# Patient Record
Sex: Male | Born: 2000 | Race: White | Hispanic: No | Marital: Single | Smoking: Never smoker
Health system: Southern US, Community
[De-identification: ages and names within clinical notes are randomized; demographics above are authoritative.]

## PROBLEM LIST (undated history)

## (undated) DIAGNOSIS — E669 Obesity, unspecified: Secondary | ICD-10-CM

## (undated) DIAGNOSIS — F909 Attention-deficit hyperactivity disorder, unspecified type: Secondary | ICD-10-CM

## (undated) DIAGNOSIS — F419 Anxiety disorder, unspecified: Secondary | ICD-10-CM

## (undated) DIAGNOSIS — S83249A Other tear of medial meniscus, current injury, unspecified knee, initial encounter: Secondary | ICD-10-CM

## (undated) DIAGNOSIS — E66811 Obesity, class 1: Secondary | ICD-10-CM

## (undated) HISTORY — DX: Attention-deficit hyperactivity disorder, unspecified type: F90.9

## (undated) HISTORY — PX: KNEE ARTHROSCOPY: SHX127

## (undated) HISTORY — DX: Obesity, class 1: E66.811

## (undated) HISTORY — DX: Anxiety disorder, unspecified: F41.9

## (undated) HISTORY — DX: Obesity, unspecified: E66.9

---
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
## 2018-01-22 ENCOUNTER — Encounter: Payer: Self-pay | Admitting: Pediatrics

## 2018-01-22 ENCOUNTER — Institutional Professional Consult (permissible substitution): Payer: 59 | Admitting: Pediatrics

## 2018-01-22 ENCOUNTER — Ambulatory Visit (INDEPENDENT_AMBULATORY_CARE_PROVIDER_SITE_OTHER): Payer: 59 | Admitting: Pediatrics

## 2018-01-22 VITALS — BP 128/69 | HR 53 | Ht 71.0 in | Wt 230.0 lb

## 2018-01-22 DIAGNOSIS — F900 Attention-deficit hyperactivity disorder, predominantly inattentive type: Secondary | ICD-10-CM

## 2018-01-22 DIAGNOSIS — Z79899 Other long term (current) drug therapy: Secondary | ICD-10-CM

## 2018-01-22 DIAGNOSIS — Z719 Counseling, unspecified: Secondary | ICD-10-CM

## 2018-01-22 DIAGNOSIS — R278 Other lack of coordination: Secondary | ICD-10-CM | POA: Diagnosis not present

## 2018-01-22 DIAGNOSIS — Z7189 Other specified counseling: Secondary | ICD-10-CM

## 2018-01-22 DIAGNOSIS — F411 Generalized anxiety disorder: Secondary | ICD-10-CM

## 2018-01-22 MED ORDER — AMPHETAMINE SULFATE 10 MG PO TABS: 20.0000 mg | ORAL_TABLET | Freq: Two times a day (BID) | ORAL | 0 refills | Status: DC

## 2018-01-22 MED ORDER — SERTRALINE HCL 100 MG PO TABS: 200.0000 mg | ORAL_TABLET | Freq: Every day | ORAL | 0 refills | Status: DC

## 2018-01-22 MED ORDER — GUANFACINE HCL ER 3 MG PO TB24: 1.0000 | ORAL_TABLET | Freq: Every evening | ORAL | 0 refills | Status: DC

## 2018-01-22 NOTE — Patient Instructions (Addendum)
DISCUSSION: Patient and family counseled regarding the following coordination of care items:  Continue medication as directed Evekeo 10 mg BID Zoloft 200 mg daily Intuniv 3 mg daily  90 day supply of above submitted. Father aware of possible price change to generic evekeo for 2020.  Will discuss if price is unaffordable. RX for above e-scribed and sent to pharmacy on record  [id number] [place] [place] - [address] [place] [address] Phone: [phone] Fax: [phone]   Counseled medication administration, effects, and possible side effects.  ADHD medications discussed to include different medications and pharmacologic properties of each. Recommendation for specific medication to include dose, administration, expected effects, possible side effects and the risk to benefit ratio of medication management.  Advised importance of:  Good sleep hygiene (8- 10 hours per night)  Limited screen time (none on school nights, no more than 2 hours on weekends)  Regular exercise(outside and active play)  Healthy eating (drink water, no sodas/sweet tea, limit portions and no seconds).  Counseling at this visit included the review of old records and/or current chart with the patient and family.   Counseling included the following discussion points presented at every visit to improve understanding and treatment compliance.  Recent health history and today's examination Growth and development with anticipatory guidance provided regarding brain growth, executive function maturation and pubertal development School progress and continued advocay for appropriate accommodations to include maintain Structure, routine, organization, reward, motivation and consequences.  Additionally the patient was counseled to take medication while driving.

## 2018-01-22 NOTE — Progress Notes (Signed)
Patient ID: Andrew Christian Kadow, male   DOB: [birth year], 17 y.o.   MRN: [id number]  Medication Check  Patient ID: Andrew Christian Sferrazza  DOB: [birth year]  MRN: [id number]  DATE:01/22/18 Rafael BihariKearns, Stephen C, MD  Accompanied by: Father Patient Lives with: mother, father, sister age 19, 3416 and brother age 19   Dorm living, one roommate, two beds bathroom down hallway Good dorm not too loud  HISTORY/CURRENT STATUS: Chief Complaint - Polite and cooperative and present for medical follow up for medication management of ADHD, dysgraphia and anxiety. Last follow up 08/03/2017 and currently prescribed Evekeo 10 mg one every morning and 1/2 to one in the PM if needed. Zoloft 200 mg in the Am and Intuniv 3 mg in the PM. Patient reports daily compliance and medication working well for first year of college.  EDUCATION: School: [employer] Year/Grade: [employer] Playing football, will have spring work outs three days per week, but with more free time Might think about clubs, was busy first Engineer, sitesemester  Micro, calc, Audiological scientistaccounting, earth sci, writing class Thinking of major in finance may do MS with supply chain managment A/B, one C in Calculus DSO accommodations - didn't use this semester.  No car at school this year.  MEDICAL HISTORY: Appetite: WNL   Sleep: Bedtime: up late at times at school, in bed by 2400, occasional later Awakens: 0900 Concerns: Initiation/Maintenance/Other: Asleep easily, sleeps through the night, feels well-rested.  No Sleep concerns.  Individual Medical History/ Review of Systems: Changes? :No  Family Medical/ Social History: Changes? No  Current Medications:  Evekeo 10 mg one in the am and 1/2 to one in the PM Zoloft 200 mg every morning Intuniv 3 mg in the afternoon Medication Side Effects: None  MENTAL HEALTH: Mental Health Issues:  Denies sadness, loneliness or depression. No self harm or thoughts of self harm or injury. Denies fears, worries and anxieties. Has good  peer relations and is not a bully nor is victimized.  Review of Systems  Musculoskeletal: Negative.   Neurological: Negative for seizures and headaches.  Psychiatric/Behavioral: Positive for decreased concentration and sleep disturbance. Negative for behavioral problems. The patient is nervous/anxious. The patient is not hyperactive.   All other systems reviewed and are negative.   PHYSICAL EXAM; Vitals:   01/22/18 0828  BP: 128/69  Pulse: (!) 53  Weight: 230 lb (104.3 kg)  Height: 5\' 11"  (1.803 m)   Body mass index is 32.08 kg/m.  General Physical Exam: Unchanged from previous exam, date:08/03/2017   Testing/Developmental Screens: CGI/ASRS = 18/11 Reviewed with patient and Father     DIAGNOSES:    ICD-10-CM   1. Attention deficit hyperactivity disorder (ADHD), predominantly inattentive type F90.0   2. Dysgraphia R27.8   3. GAD (generalized anxiety disorder) F41.1   4. Medication management Z79.899   5. Patient counseled Z71.9   6. Parenting dynamics counseling Z71.89   7. Counseling and coordination of care Z71.89     RECOMMENDATIONS:  Patient Instructions  DISCUSSION: Patient and family counseled regarding the following coordination of care items:  Continue medication as directed Evekeo 10 mg BID Zoloft 200 mg daily Intuniv 3 mg daily  90 day supply of above submitted. Father aware of possible price change to generic evekeo for 2020.  Will discuss if price is unaffordable. RX for above e-scribed and sent to pharmacy on record  [id number] [place], [place] - [address] [place] [address]  Phone: [phone] Fax: [phone]   Counseled medication administration, effects, and possible side effects.  ADHD medications discussed to include different medications and pharmacologic properties of each. Recommendation for specific medication to include dose, administration, expected effects,  possible side effects and the risk to benefit ratio of medication management.  Advised importance of:  Good sleep hygiene (8- 10 hours per night)  Limited screen time (none on school nights, no more than 2 hours on weekends)  Regular exercise(outside and active play)  Healthy eating (drink water, no sodas/sweet tea, limit portions and no seconds).  Counseling at this visit included the review of old records and/or current chart with the patient and family.   Counseling included the following discussion points presented at every visit to improve understanding and treatment compliance.  Recent health history and today's examination Growth and development with anticipatory guidance provided regarding brain growth, executive function maturation and pubertal development School progress and continued advocay for appropriate accommodations to include maintain Structure, routine, organization, reward, motivation and consequences.  Additionally the patient was counseled to take medication while driving.      Father verbalized understanding of all topics discussed.  NEXT APPOINTMENT:  Return in about 3 months (around 04/23/2018) for Medical Follow up.  Medical Decision-making: More than 50% of the appointment was spent counseling and discussing diagnosis and management of symptoms with the patient and family.  Counseling Time: 25 minutes Total Contact Time: 30 minutes

## 2018-04-13 ENCOUNTER — Other Ambulatory Visit: Payer: Self-pay | Admitting: Pediatrics

## 2018-04-13 NOTE — Telephone Encounter (Signed)
Last visit 01/22/2018 next visit 07/20/2018

## 2018-04-13 NOTE — Telephone Encounter (Signed)
RX for above e-scribed and sent to pharmacy on record  [id number] [place], [place] - [address] [place] [address] Phone: [phone] Fax: [phone]   

## 2018-05-07 ENCOUNTER — Other Ambulatory Visit: Payer: Self-pay

## 2018-05-07 MED ORDER — AMPHETAMINE SULFATE 10 MG PO TABS: 20.0000 mg | ORAL_TABLET | Freq: Two times a day (BID) | ORAL | 0 refills | Status: DC

## 2018-05-07 MED ORDER — GUANFACINE HCL ER 3 MG PO TB24: 1.0000 | ORAL_TABLET | Freq: Every evening | ORAL | 0 refills | Status: DC

## 2018-05-07 NOTE — Telephone Encounter (Signed)
RX for above e-scribed and sent to pharmacy on record  [id number] [place], [place] - [address] [place] [address] Phone: [phone] Fax: [phone]   

## 2018-05-07 NOTE — Telephone Encounter (Signed)
Mom called in refill for Evekeo and Guanfacine. Last visit 01/22/2018 next visit 07/20/2018. [facility] [place] [place]

## 2018-07-20 ENCOUNTER — Encounter: Payer: Self-pay | Admitting: Pediatrics

## 2018-07-20 ENCOUNTER — Telehealth: Payer: Self-pay

## 2018-07-20 ENCOUNTER — Ambulatory Visit (INDEPENDENT_AMBULATORY_CARE_PROVIDER_SITE_OTHER): Payer: 59 | Admitting: Pediatrics

## 2018-07-20 DIAGNOSIS — Z7189 Other specified counseling: Secondary | ICD-10-CM

## 2018-07-20 DIAGNOSIS — R278 Other lack of coordination: Secondary | ICD-10-CM

## 2018-07-20 DIAGNOSIS — F900 Attention-deficit hyperactivity disorder, predominantly inattentive type: Secondary | ICD-10-CM | POA: Diagnosis not present

## 2018-07-20 DIAGNOSIS — F411 Generalized anxiety disorder: Secondary | ICD-10-CM | POA: Diagnosis not present

## 2018-07-20 DIAGNOSIS — Z79899 Other long term (current) drug therapy: Secondary | ICD-10-CM

## 2018-07-20 DIAGNOSIS — Z719 Counseling, unspecified: Secondary | ICD-10-CM

## 2018-07-20 MED ORDER — SERTRALINE HCL 100 MG PO TABS: 150.0000 mg | ORAL_TABLET | Freq: Every morning | ORAL | 2 refills | Status: DC

## 2018-07-20 MED ORDER — GUANFACINE HCL ER 3 MG PO TB24: 1.0000 | ORAL_TABLET | Freq: Every morning | ORAL | 0 refills | Status: DC

## 2018-07-20 MED ORDER — AMPHETAMINE SULFATE 10 MG PO TABS: 20.0000 mg | ORAL_TABLET | Freq: Two times a day (BID) | ORAL | 0 refills | Status: DC

## 2018-07-20 NOTE — Progress Notes (Signed)
[facility]. [address] [place] [address] Dept: [phone] Dept Fax: [phone]  Medication Check by FaceTime due to COVID-19  Patient ID:  Andrew Christian  male DOB: [birth year]   18 y.o.   MRN: [id number]   DATE:07/20/18  PCP: Hezzie Bump, MD  Interviewed: [name]  Location: His vehicle, not driving Provider location: [facility] office  Virtual Visit via Video Note Connected with Andrew Christian on 07/20/18 at  8:30 AM EDT by video enabled telemedicine application and verified that I am speaking with the correct person using two identifiers.    I discussed the limitations, risks, security and privacy concerns of performing an evaluation and management service by telephone and the availability of in person appointments. I also discussed with the patient that there may be a patient responsible charge related to this service. The patient expressed understanding and agreed to proceed.  HISTORY OF PRESENT ILLNESS/CURRENT STATUS: Andrew Christian is being followed for medication management for ADHD, dysgraphia and Anxiety with OCD featrues.   Last visit on 01/23/2019  Cardin currently prescribed Zoloft 200 mg and Evekeo 20 mg twice daily Intuniv 3 mg daily Daytime sleepiness past few months. Eating well (eating breakfast, lunch and dinner).   Sleeping: bedtime 2400-0100 pm and wakes at varied - will be up by 0730 now for work Does not feel tired, sometimes TV and phones but not right up to going to bed.  Feels tired around 2100, but then it goes away. sleeping through the night.  counseled regards sleep hygiene and new work schedule.  Set bedtime by 2200 and up no later than 0800.  Will trial lower dose of zoloft, since patient does not report any anxiety issues presently.  New Job: [employer] orientation - First day on 07/21/2018 May do [place] as family trip later this  summer.  EDUCATION: School: [employer] Year/Grade: Tanis Burnley is currently out of school for social distancing due to COVID-19. Home for spring break then did not go back Finished all classes on line - got all As had to get used to at first but did well Accounting, macro eco, stats, spanish and writing. Will return to campus normal time early for football, classes until Thanksgiving and then finish on-line through winter break  Activities/ Exercise: daily has been working out five days per week for football, will do running all for football conditioning.    Screen time: (phone, tablet, TV, computer): not excessive  MEDICAL HISTORY: Individual Medical History/ Review of Systems: Changes? :No  Family Medical/ Social History: Changes? No   Patient Lives with: mother and father  Sister 51, 43 and brother 15 Mother always works from home Father is now working at home too  Current Medications:  zoloft 200 mg daily evekeo 20 mg BID Intuniv 3mg  daily  Medication Side Effects: Fatigue  MENTAL HEALTH: Mental Health Issues:    Denies sadness, loneliness or depression. No self harm or thoughts of self harm or injury. Denies fears, worries and anxieties. Has good peer relations and is not a bully nor is victimized.  DIAGNOSES:    ICD-10-CM   1. Attention deficit hyperactivity disorder (ADHD), predominantly inattentive type  F90.0   2. Dysgraphia  R27.8   3. GAD (generalized anxiety disorder)  F41.1   4. Medication management  Z79.899   5. Patient counseled  Z71.9   6. Counseling and coordination of care  Z71.89  RECOMMENDATIONS:  Patient Instructions  DISCUSSION: Counseled regarding the following coordination of care items:  Continue medication as directed Decrease Zoloft to 150 mg daily Continue Evekeo 20 mg twice daily Intuniv 3 mg daily - morning dose  RX for above e-scribed and sent to pharmacy on record  [id number] - [place], [place] -  [address] [place] [address] Phone: [phone] Fax: [phone]  Counseled medication administration, effects, and possible side effects.  ADHD medications discussed to include different medications and pharmacologic properties of each. Recommendation for specific medication to include dose, administration, expected effects, possible side effects and the risk to benefit ratio of medication management.  Advised importance of:  Good sleep hygiene (8- 10 hours per night) Set routine, in bed by 2200 and up by 0830  Limited screen time (none on school nights, no more than 2 hours on weekends)  Regular exercise(outside and active play) Make sure to do aerobic exercise (cross train) with weight training for football  Healthy eating (drink water, no sodas/sweet tea)     Discussed continued need for routine, structure, motivation, reward and positive reinforcement  Encouraged recommended limitations on TV, tablets, phones, video games and computers for non-educational activities.  Encouraged physical activity and outdoor play, maintaining social distancing.  Discussed how to talk to anxious children about coronavirus.   Referred to ADDitudemag.com for resources about engaging children who are at home in home and online study.    NEXT APPOINTMENT:  Return in about 3 months (around 10/20/2018) for Medication Check. Please call the office for a sooner appointment if problems arise.  Medical Decision-making: More than 50% of the appointment was spent counseling and discussing diagnosis and management of symptoms with the patient.  I discussed the assessment and treatment plan with the patient. The patient was provided an opportunity to ask questions and all were answered. The patient agreed with the plan and demonstrated an understanding of the instructions.   The patient was advised to call back or seek an in-person  evaluation if the symptoms worsen or if the condition fails to improve as anticipated.  I provided 25 minutes of non-face-to-face time during this encounter.   Completed record review for 0 minutes prior to the virtual video visit.   Leticia PennaBobi A Ronen Bromwell, NP  Counseling Time: 25 minutes   Total Contact Time: 25 minutes

## 2018-07-20 NOTE — Telephone Encounter (Signed)
Pharm faxed in Prior Auth for Evekeo. Last visit 07/20/2018. Submitting Prior Auth to [facility]

## 2018-07-20 NOTE — Patient Instructions (Signed)
DISCUSSION: Counseled regarding the following coordination of care items:  Continue medication as directed Decrease Zoloft to 150 mg daily Continue Evekeo 20 mg twice daily Intuniv 3 mg daily - morning dose  RX for above e-scribed and sent to pharmacy on record  [id number] - [place], [place] - [address] [place] [address] Phone: [phone] Fax: [phone]  Counseled medication administration, effects, and possible side effects.  ADHD medications discussed to include different medications and pharmacologic properties of each. Recommendation for specific medication to include dose, administration, expected effects, possible side effects and the risk to benefit ratio of medication management.  Advised importance of:  Good sleep hygiene (8- 10 hours per night) Set routine, in bed by 2200 and up by 0830  Limited screen time (none on school nights, no more than 2 hours on weekends)  Regular exercise(outside and active play) Make sure to do aerobic exercise (cross train) with weight training for football  Healthy eating (drink water, no sodas/sweet tea)

## 2018-07-20 NOTE — Telephone Encounter (Signed)
Outcome Additional Information Required This medication or product is on your plan's list of covered drugs. Prior authorization is not required at this time. If your pharmacy has questions regarding the processing of your prescription, please have them call the [facility] pharmacy help desk at [phone]. **Please note: Formulary lowering, tiering exception, cost reduction and/or pre-benefit determination review (including prospective Medicare hospice reviews) requests cannot be requested using this method of submission. Please contact us at [phone] instead. 

## 2018-08-13 ENCOUNTER — Ambulatory Visit (INDEPENDENT_AMBULATORY_CARE_PROVIDER_SITE_OTHER): Payer: 59 | Admitting: Family Medicine

## 2018-08-13 ENCOUNTER — Encounter: Payer: Self-pay | Admitting: Family Medicine

## 2018-08-13 ENCOUNTER — Other Ambulatory Visit: Payer: Self-pay

## 2018-08-13 VITALS — BP 100/61 | HR 54 | Temp 98.0°F | Resp 16 | Ht 71.0 in | Wt 236.6 lb

## 2018-08-13 DIAGNOSIS — G4719 Other hypersomnia: Secondary | ICD-10-CM

## 2018-08-13 DIAGNOSIS — R5383 Other fatigue: Secondary | ICD-10-CM

## 2018-08-13 DIAGNOSIS — R400 Somnolence: Secondary | ICD-10-CM

## 2018-08-13 LAB — CBC WITH DIFFERENTIAL/PLATELET
Basophils Absolute: 0 10*3/uL (ref 0.0–0.1)
Basophils Relative: 0.7 % (ref 0.0–3.0)
Eosinophils Absolute: 0.2 10*3/uL (ref 0.0–0.7)
Eosinophils Relative: 4 % (ref 0.0–5.0)
HCT: 46.1 % (ref 36.0–49.0)
Hemoglobin: 15.8 g/dL (ref 12.0–16.0)
Lymphocytes Relative: 40 % (ref 24.0–48.0)
Lymphs Abs: 2 10*3/uL (ref 0.7–4.0)
MCHC: 34.1 g/dL (ref 31.0–37.0)
MCV: 87.7 fl (ref 78.0–98.0)
Monocytes Absolute: 0.5 10*3/uL (ref 0.1–1.0)
Monocytes Relative: 11.2 % (ref 3.0–12.0)
Neutro Abs: 2.2 10*3/uL (ref 1.4–7.7)
Neutrophils Relative %: 44.1 % (ref 43.0–71.0)
Platelets: 262 10*3/uL (ref 150.0–575.0)
RBC: 5.26 Mil/uL (ref 3.80–5.70)
RDW: 13.2 % (ref 11.4–15.5)
WBC: 4.9 10*3/uL (ref 4.5–13.5)

## 2018-08-13 LAB — COMPREHENSIVE METABOLIC PANEL
ALT: 16 U/L (ref 0–53)
AST: 25 U/L (ref 0–37)
Albumin: 4.5 g/dL (ref 3.5–5.2)
Alkaline Phosphatase: 66 U/L (ref 52–171)
BUN: 20 mg/dL (ref 6–23)
CO2: 30 mEq/L (ref 19–32)
Calcium: 9.9 mg/dL (ref 8.4–10.5)
Chloride: 102 mEq/L (ref 96–112)
Creatinine, Ser: 1.47 mg/dL (ref 0.40–1.50)
GFR: 61.29 mL/min (ref >60.00)
Glucose, Bld: 81 mg/dL (ref 70–99)
Potassium: 4.2 mEq/L (ref 3.5–5.1)
Sodium: 140 mEq/L (ref 135–145)
Total Bilirubin: 0.5 mg/dL (ref 0.2–1.2)
Total Protein: 7.4 g/dL (ref 6.0–8.3)

## 2018-08-13 LAB — TSH: TSH: 1.81 u[IU]/mL (ref 0.40–5.00)

## 2018-08-13 NOTE — Addendum Note (Signed)
Addended by: Tammi Sou on: 08/13/2018 09:52 AM   Modules accepted: Orders

## 2018-08-13 NOTE — Progress Notes (Signed)
Office Note 08/13/2018  CC:  Chief Complaint  Patient presents with  . Establish Care    Previous PCP, Dr.Kearns, discuss personal issues    HPI:  Andrew Christian is a 18 y.o. male who is here to establish care. Patient's most recent primary MD: [facility] in [place]. [facility] in [place] for psych dx mgmt. Old records in EPIC/HL EMR were reviewed prior to or during today's visit.  Daytime sleepiness: about 6 wks ago when on day shift he felt very sleepy all day.  Bed at midnight and up around 7AM at that time.  Slept ok and sleep felt restorative.  No otc med use.   Switched to Midnight to 5 AM shift 1 mo ago and his level of excessive sleepiness in daytime has lessened. Goes to bed 6-7 AM now and wakes up around 2-4 pm (6-8 hours avg). Has deviated septum.  +Snores.  No report of witnessed apneic events. Takes his evekeo only during school.  Takes guanfacine and sertraline daily and these have been long term meds for him.  Recently decreased sertraline dose from 200 qd to 150mg  qd about 1 mo ago.  Sometimes fatigued as well as daytime sleepiness. Exercises 3 d/week for football workouts and has no problems with these.  Past Medical History:  Diagnosis Date  . ADHD (attention deficit hyperactivity disorder)    managed by psych  . Anxiety    managed by psych  . Medial meniscus tear 07/2016   right knee    Past Surgical History:  Procedure Laterality Date  . KNEE ARTHROSCOPY Bilateral     Family History  Problem Relation Age of Onset  . Thyroid disease Mother   . High Cholesterol Father   . ADD / ADHD Sister   . Thyroid disease Maternal Grandmother   . Heart disease Maternal Grandfather   . Heart attack Maternal Grandfather   . High Cholesterol Maternal Grandfather   . Depression Paternal Grandmother     Social History   Socioeconomic History  . Marital status: Single    Spouse name: Not on file  . Number of children:  Not on file  . Years of education: Not on file  . Highest education level: Not on file  Occupational History  . Not on file  Social Needs  . Financial resource strain: Not on file  . Food insecurity    Worry: Not on file    Inability: Not on file  . Transportation needs    Medical: Not on file    Non-medical: Not on file  Tobacco Use  . Smoking status: Never Smoker  . Smokeless tobacco: Never Used  Substance and Sexual Activity  . Alcohol use: No    Alcohol/week: 0.0 standard drinks  . Drug use: No  . Sexual activity: Never  Lifestyle  . Physical activity    Days per week: Not on file    Minutes per session: Not on file  . Stress: Not on file  Relationships  . Social Herbalist on phone: Not on file    Gets together: Not on file    Attends religious service: Not on file    Active member of club or organization: Not on file    Attends meetings of clubs or organizations: Not on file    Relationship status: Not on file  . Intimate partner violence    Fear of current or ex partner: Not on file  Emotionally abused: Not on file    Physically abused: Not on file    Forced sexual activity: Not on file  Other Topics Concern  . Not on file  Social History Narrative   [employer] grad.   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   Fraternity.   Alc: social.   Tob: none   No drugs.    Outpatient Encounter Medications as of 08/13/2018  Medication Sig  . Amphetamine Sulfate (EVEKEO) 10 MG TABS Take 20 mg by mouth 2 (two) times daily.  . GuanFACINE HCl 3 MG TB24 Take 1 tablet (3 mg total) by mouth every morning.  . sertraline (ZOLOFT) 100 MG tablet Take 1.5 tablets (150 mg total) by mouth every morning.  . [DISCONTINUED] MELATONIN PO Take 5 mg by mouth.    No facility-administered encounter medications on file as of 08/13/2018.     No Known Allergies  ROS Review of Systems  Constitutional: Positive for fatigue. Negative for appetite change, chills  and fever.  HENT: Negative for congestion, dental problem, ear pain and sore throat.   Eyes: Negative for discharge, redness and visual disturbance.  Respiratory: Negative for cough, chest tightness, shortness of breath and wheezing.   Cardiovascular: Negative for chest pain, palpitations and leg swelling.  Gastrointestinal: Negative for abdominal pain, blood in stool, diarrhea, nausea and vomiting.  Genitourinary: Negative for difficulty urinating, dysuria, flank pain, frequency, hematuria and urgency.  Musculoskeletal: Negative for arthralgias, back pain, joint swelling, myalgias and neck stiffness.  Skin: Negative for pallor and rash.  Neurological: Negative for dizziness, speech difficulty, weakness and headaches.  Hematological: Negative for adenopathy. Does not bruise/bleed easily.  Psychiatric/Behavioral: Negative for confusion and sleep disturbance. The patient is not nervous/anxious.     PE; Blood pressure 100/61, pulse (!) 54, temperature 98 F (36.7 C), temperature source Temporal, resp. rate 16, height 5\' 11"  (1.803 m), weight 236 lb 9.6 oz (107.3 kg), SpO2 98 %. VITALS: Blood pressure 100/61, pulse (!) 54, temperature 98 F (36.7 C), temperature source Temporal, resp. rate 16, height 5\' 11"  (1.803 m), weight 236 lb 9.6 oz (107.3 kg), SpO2 98 %. Gen: alert, well appearing. HEENT: eyes without swelling, eryth, or drainage. Nose: significant septal deviation to the left, moderate left nasal passage obstruction  Throat: no swelling, erythema, or exudate.  Tonsils minimal size. Neck: no adenopathy, thyromegaly, or tenderness. Lungs: CTA bilat, nonlabored resps.  CV: RRR, no m/r/g Abd: soft, NT. EXT: no edema, no cyanosis, no clubbing.  Pertinent labs:  None today  ASSESSMENT AND PLAN:  New pt:  Excessive daytime sleepiness: ? Shift work problem? However, he says he has had at least mild excessive daytime sleepiness since middle school. Will refer for sleep eval/sleep  study. Check TSH, CBC w/diff, and CMET today.  He'll continue to see his psych provider for ADHD and anxiety. I made no changes to these meds today.  An After Visit Summary was printed and given to the patient.  Return in about 6 weeks (around 09/24/2018) for f/u excessive daytime sleepiness.  Signed:  Santiago BumpersPhil Cali Hope, MD           08/13/2018

## 2018-08-25 ENCOUNTER — Other Ambulatory Visit: Payer: Self-pay | Admitting: Pediatrics

## 2018-08-25 NOTE — Telephone Encounter (Signed)
Last visit 07/20/2018 next visit 09/02/2018

## 2018-08-25 NOTE — Telephone Encounter (Signed)
RX for above e-scribed and sent to pharmacy on record  [id number] - [place], [place] - [address] [place] [address] Phone: [phone] Fax: [phone]   

## 2018-09-02 ENCOUNTER — Institutional Professional Consult (permissible substitution): Payer: 59 | Admitting: Neurology

## 2018-09-07 ENCOUNTER — Encounter: Payer: Self-pay | Admitting: Neurology

## 2018-09-22 ENCOUNTER — Other Ambulatory Visit: Payer: Self-pay

## 2018-09-22 MED ORDER — EVEKEO ODT 10 MG PO TBDP: 20.0000 mg | ORAL_TABLET | Freq: Two times a day (BID) | ORAL | 0 refills | Status: DC

## 2018-09-22 NOTE — Telephone Encounter (Signed)
Mom called in wanting to switch patient to [facility] ODT 10mg . Spoke with Provider and she is fine with switching. Last visit 07/20/2018. Please escribe to [facility] in [place], [place]

## 2018-09-22 NOTE — Telephone Encounter (Signed)
RX for above e-scribed and sent to pharmacy on record  [id number] - [place], [place] - [address] [place]  [address] Phone: [phone] Fax: [phone]   

## 2018-09-23 ENCOUNTER — Telehealth: Payer: Self-pay

## 2018-09-23 NOTE — Telephone Encounter (Signed)
Pharm faxed in Prior Auth for Evekeo ODT. Last visit 07/20/2018. Submitting Prior Auth to [facility]

## 2018-09-23 NOTE — Telephone Encounter (Signed)
This medication or product is on your plan's list of covered drugs. Prior authorization is not required at this time. If your pharmacy has questions regarding the processing of your prescription, please have them call the [facility] pharmacy help desk at [phone]. **Please note: Formulary lowering, tiering exception, cost reduction and/or pre-benefit determination review (including prospective Medicare hospice reviews) requests cannot be requested using this method of submission. Please contact us at [phone] instead.

## 2018-09-27 MED ORDER — EVEKEO ODT 20 MG PO TBDP: 20.0000 mg | ORAL_TABLET | Freq: Two times a day (BID) | ORAL | 0 refills | Status: DC

## 2018-09-27 NOTE — Addendum Note (Signed)
Addended by: Lavell Luster A on: 09/27/2018 12:31 PM   Modules accepted: Orders

## 2018-09-27 NOTE — Addendum Note (Signed)
Addended by: Venetia Maxon on: 09/27/2018 10:47 AM   Modules accepted: Orders

## 2018-09-27 NOTE — Telephone Encounter (Signed)
Switching Patient to 20mg 

## 2018-09-27 NOTE — Telephone Encounter (Signed)
Resubmitted for higher strength tab. RX for above e-scribed and sent to pharmacy on record  [facility] [place], [place] - [address] [place] [address] [place] [address] Phone: [phone] Fax: [phone]

## 2018-09-30 ENCOUNTER — Encounter: Payer: Self-pay | Admitting: Pediatrics

## 2018-10-19 ENCOUNTER — Ambulatory Visit (INDEPENDENT_AMBULATORY_CARE_PROVIDER_SITE_OTHER): Payer: 59 | Admitting: Pediatrics

## 2018-10-19 ENCOUNTER — Other Ambulatory Visit: Payer: Self-pay

## 2018-10-19 ENCOUNTER — Encounter: Payer: Self-pay | Admitting: Pediatrics

## 2018-10-19 DIAGNOSIS — Z79899 Other long term (current) drug therapy: Secondary | ICD-10-CM

## 2018-10-19 DIAGNOSIS — F900 Attention-deficit hyperactivity disorder, predominantly inattentive type: Secondary | ICD-10-CM | POA: Diagnosis not present

## 2018-10-19 DIAGNOSIS — F411 Generalized anxiety disorder: Secondary | ICD-10-CM | POA: Diagnosis not present

## 2018-10-19 DIAGNOSIS — R278 Other lack of coordination: Secondary | ICD-10-CM

## 2018-10-19 DIAGNOSIS — Z719 Counseling, unspecified: Secondary | ICD-10-CM

## 2018-10-19 DIAGNOSIS — Z7189 Other specified counseling: Secondary | ICD-10-CM

## 2018-10-19 MED ORDER — EVEKEO ODT 20 MG PO TBDP: 20.0000 mg | ORAL_TABLET | Freq: Two times a day (BID) | ORAL | 0 refills | Status: DC

## 2018-10-19 MED ORDER — GUANFACINE HCL ER 3 MG PO TB24: 1.0000 | ORAL_TABLET | Freq: Every morning | ORAL | 0 refills | Status: DC

## 2018-10-19 NOTE — Addendum Note (Signed)
Addended by: Kristen Bushway A on: 10/19/2018 09:20 AM   Modules accepted: Orders

## 2018-10-19 NOTE — Progress Notes (Signed)
[facility] [address] [place] [address] Dept: [phone] Dept Fax: [phone]  Medication Check by FaceTime due to COVID-19  Patient ID:  Andrew JacksonJacob Casados  male DOB: 04/03/2000   18 y.o.   MRN: [id number]   DATE:10/19/18  PCP: Jeoffrey MassedMcGowen, Philip H, MD  Interviewed: Andrew JacksonJacob Stirn  Location: His college  room Provider [facility]: [facility]  Virtual Visit via Video Note Connected with Andrew JacksonJacob Furtick on 10/19/18 at  8:00 AM EDT by video enabled telemedicine application and verified that I am speaking with the correct person using two identifiers.     I discussed the limitations, risks, security and privacy concerns of performing an evaluation and management service by telephone and the availability of in person appointments. I also discussed with the parent/patient that there may be a patient responsible charge related to this service. The parent/patient expressed understanding and agreed to proceed.  HISTORY OF PRESENT ILLNESS/CURRENT STATUS: Andrew Christian is being followed for medication management for ADHD, dysgraphia and anxiety.   Last visit on 07/17/2018  Gerilyn PilgrimJacob currently prescribed Evekeo 20 mg BID, taking one usually.  Intuniv 3 mg every morning Zoloft 100 mg taking one and a half (150 mg). Daily.    Behaviors doing well. Back at school for in person football training, no season Feels that he is flat and would like to adjust medication.  Has been on zoloft for a long time and we will begin to wean off. Counseled regarding wean schedule and follow up visit to discuss options.  Eating well (eating breakfast, lunch and dinner).   Sleeping: bedtime 2430 - usually earlier and able to sleep  Sleeping through the night.  - no  EDUCATION: [employer] - no classes in person M, W 0900 to 1430  T, Th 1130 - 1245 2:30 to 3:45 nothing on Friday.  Lives with four others with their  own room, air B&B.  Pretty close.  Activities/ Exercise: daily  Football practice with restrictions in person.  NO season this semester M 7 to 9 pm th 7 to 9 am  Screen time: (phone, tablet, TV, computer): non-essential  MEDICAL HISTORY: Individual Medical History/ Review of Systems: Changes? :No  Family Medical/ Social History: Changes? No   Patient Lives with: Roommates  MENTAL HEALTH: Mental Health Issues:    Denies sadness, loneliness or depression. No self harm or thoughts of self harm or injury. Denies fears, worries and anxieties. Has good peer relations and is not a bully nor is victimized. Coping doing well with classes and football schedule  DIAGNOSES:    ICD-10-CM   1. Attention deficit hyperactivity disorder (ADHD), predominantly inattentive type  F90.0   2. Dysgraphia  R27.8   3. GAD (generalized anxiety disorder)  F41.1   4. Medication management  Z79.899   5. Patient counseled  Z71.9   6. Parenting dynamics counseling  Z71.89   7. Counseling and coordination of care  Z71.89      RECOMMENDATIONS:  Patient Instructions  DISCUSSION: Counseled regarding the following coordination of care items:  Continue medication as directed Evekeo 20 mg - one twice daily Intuniv 3 mg every morning  Wean and discontinue Zoloft  Take 100 mg daily for 7 days Take 50 mg daily for 7 days  Stop zoloft.  Counseled medication administration, effects, and possible side effects.  ADHD medications discussed to include different medications and pharmacologic properties of each. Recommendation for specific medication to include  dose, administration, expected effects, possible side effects and the risk to benefit ratio of medication management.  Counseled regarding dose wean and side effects from abrupt changes.  If any flu like symptoms, irritability or behavioral issues arise, please return to previous dose level.  May need to wean more slowly.   Patient has my email if questions  arise.  Wean schedule emailed.  Advised importance of:  Good sleep hygiene (8- 10 hours per night)  Limited screen time (none on school nights, no more than 2 hours on weekends)  Regular exercise(outside and active play)  Healthy eating (drink water, no sodas/sweet tea)      Discussed continued need for routine, structure, motivation, reward and positive reinforcement  Encouraged recommended limitations on TV, tablets, phones, video games and computers for non-educational activities.  Encouraged physical activity and outdoor play, maintaining social distancing.  Discussed how to talk to anxious children about coronavirus.   Referred to ADDitudemag.com for resources about engaging children who are at home in home and online study.    NEXT APPOINTMENT:  Return in about 4 weeks (around 11/16/2018) for Medication Check. Please call the office for a sooner appointment if problems arise.  Medical Decision-making: More than 50% of the appointment was spent counseling and discussing diagnosis and management of symptoms with the parent/patient.  I discussed the assessment and treatment plan with the parent. The parent/patient was provided an opportunity to ask questions and all were answered. The parent/patient agreed with the plan and demonstrated an understanding of the instructions.   The parent/patient was advised to call back or seek an in-person evaluation if the symptoms worsen or if the condition fails to improve as anticipated.  I provided 25 minutes of non-face-to-face time during this encounter.   Completed record review for 0 minutes prior to the virtual video visit.   Len Childs, NP  Counseling Time: 25 minutes   Total Contact Time: 25 minutes

## 2018-10-19 NOTE — Patient Instructions (Addendum)
DISCUSSION: Counseled regarding the following coordination of care items:  Continue medication as directed Evekeo 20 mg - one twice daily Intuniv 3 mg every morning  Wean and discontinue Zoloft  Take 100 mg daily for 7 days Take 50 mg daily for 7 days  Stop zoloft.  Counseled medication administration, effects, and possible side effects.  ADHD medications discussed to include different medications and pharmacologic properties of each. Recommendation for specific medication to include dose, administration, expected effects, possible side effects and the risk to benefit ratio of medication management.  Counseled regarding dose wean and side effects from abrupt changes.  If any flu like symptoms, irritability or behavioral issues arise, please return to previous dose level.  May need to wean more slowly.   Patient has my email if questions arise.  Wean schedule emailed.  Advised importance of:  Good sleep hygiene (8- 10 hours per night)  Limited screen time (none on school nights, no more than 2 hours on weekends)  Regular exercise(outside and active play)  Healthy eating (drink water, no sodas/sweet tea)

## 2018-11-16 ENCOUNTER — Encounter: Payer: Self-pay | Admitting: Pediatrics

## 2018-11-16 ENCOUNTER — Other Ambulatory Visit: Payer: Self-pay

## 2018-11-16 ENCOUNTER — Ambulatory Visit (INDEPENDENT_AMBULATORY_CARE_PROVIDER_SITE_OTHER): Payer: 59 | Admitting: Pediatrics

## 2018-11-16 DIAGNOSIS — F900 Attention-deficit hyperactivity disorder, predominantly inattentive type: Secondary | ICD-10-CM | POA: Diagnosis not present

## 2018-11-16 DIAGNOSIS — Z719 Counseling, unspecified: Secondary | ICD-10-CM

## 2018-11-16 DIAGNOSIS — Z79899 Other long term (current) drug therapy: Secondary | ICD-10-CM

## 2018-11-16 DIAGNOSIS — F411 Generalized anxiety disorder: Secondary | ICD-10-CM

## 2018-11-16 DIAGNOSIS — R278 Other lack of coordination: Secondary | ICD-10-CM

## 2018-11-16 DIAGNOSIS — Z7189 Other specified counseling: Secondary | ICD-10-CM

## 2018-11-16 MED ORDER — SERTRALINE HCL 100 MG PO TABS: 50.0000 mg | ORAL_TABLET | Freq: Every morning | ORAL | 2 refills | Status: DC

## 2018-11-16 MED ORDER — EVEKEO ODT 20 MG PO TBDP: 20.0000 mg | ORAL_TABLET | Freq: Two times a day (BID) | ORAL | 0 refills | Status: DC

## 2018-11-16 NOTE — Progress Notes (Signed)
[facility]. [address] [place] [address] Dept: [phone] Dept Fax: [phone]  Medication Check by FaceTime due to COVID-19  Patient ID:  Andrew Christian  male DOB: July 16, 2000   18 y.o.   MRN: [id number]   DATE:11/16/18  PCP: Andrew Sou, MD  Interviewed: Andrew Christian  Location: His college room Provider location: [facility] office  Virtual Visit via Video Note Connected with Andrew Christian on 11/16/18 at  8:30 AM EDT by video enabled telemedicine application and verified that I am speaking with the correct person using two identifiers.     I discussed the limitations, risks, security and privacy concerns of performing an evaluation and management service by telephone and the availability of in person appointments. I also discussed with the parent/patient that there may be a patient responsible charge related to this service. The parent/patient expressed understanding and agreed to proceed.  HISTORY OF PRESENT ILLNESS/CURRENT STATUS: Andrew Christian is being followed for medication management for ADHD, dysgraphia and anxiety.   Last visit on 10/19/2018 with discussion for weaning off zoloft 200 mg  Andrew Christian currently prescribed Evekeo 20 mg every morning, occasional second dose, Intuniv 3 mg at bedtime.  Wean schedule completed and off zoloft for about one full week.   Behaviors: During the week of no zoloft, felt some sadness and depression.  He went back on zoloft 50 mg and feels better now.  Feels happy where the dose is now and wants to continue at this level for awhile longer. Improved affect while on video call.  Quicker to smile, less flatness.  Eating well (eating breakfast, lunch and dinner).   Sleeping: bedtime 2400 pm  Sleeping through the night. No sleep issues  EDUCATION: School: [employer] Year/Grade: sophomore  M, W 0900 to 1430  T, Th 1130 - 1245 2:30 to 3:45  nothing on Friday.  Lives with four others with their own room, air B&B.  Pretty close.  Activities/ Exercise: daily  Football practice with restrictions in person.  NO season this semester M 7 to 9 pm th 7 to 9 am  Activities/ Exercise: daily More football work outs.  Allowing more in-person since less covid.  Screen time: (phone, tablet, TV, computer): non-essential, not excessive  MEDICAL HISTORY: Individual Medical History/ Review of Systems: Changes? :No  Family Medical/ Social History: Changes? No   Patient Lives with: roommates all healthy  Current Medications:  Evekeo 20 mg every morning, occasional PM dose depends on work load Intuniv 3 mg every evening zoloft 50 mg daily, every morning Counseled doses and flex up if needed, may consider change to prozac over winter break.  Medication Side Effects: None  MENTAL HEALTH: Mental Health Issues:    Denies sadness, loneliness or depression. No self harm or thoughts of self harm or injury. Denies fears, worries and anxieties. Has good peer relations and is not a bully nor is victimized. Coping doing very well.  DIAGNOSES:    ICD-10-CM   1. Attention deficit hyperactivity disorder (ADHD), predominantly inattentive type  F90.0   2. Dysgraphia  R27.8   3. GAD (generalized anxiety disorder)  F41.1   4. Medication management  Z79.899   5. Patient counseled  Z71.9   6. Counseling and coordination of care  Z71.89      RECOMMENDATIONS:  Patient Instructions  DISCUSSION: Counseled regarding the following coordination of care items:  Continue medication as directed Zoloft 50 mg daily Evekeo 20  mg twice daily Intuniv 3 mg daily RX for above e-scribed and sent to pharmacy on record  [id number] - [place], [place] - [address] [place] [address] Phone: [phone] Fax: [phone]  Counseled medication administration, effects, and  possible side effects.  ADHD medications discussed to include different medications and pharmacologic properties of each. Recommendation for specific medication to include dose, administration, expected effects, possible side effects and the risk to benefit ratio of medication management.  Advised importance of:  Good sleep hygiene (8- 10 hours per night)  Limited screen time (none on school nights, no more than 2 hours on weekends)  Regular exercise(outside and active play)  Healthy eating (drink water, no sodas/sweet tea)  Regular family meals have been linked to lower levels of adolescent risk-taking behavior.  Adolescents who frequently eat meals with their family are less likely to engage in risk behaviors than those who never or rarely eat with their families.  So it is never too early to start this tradition.       Discussed continued need for routine, structure, motivation, reward and positive reinforcement  Encouraged recommended limitations on TV, tablets, phones, video games and computers for non-educational activities.  Encouraged physical activity and outdoor play, maintaining social distancing.  Discussed how to talk to anxious children about coronavirus.   Referred to ADDitudemag.com for resources about engaging children who are at home in home and online study.    NEXT APPOINTMENT:  Return in about 3 months (around 02/16/2019) for Medication Check. Please call the office for a sooner appointment if problems arise.  Medical Decision-making: More than 50% of the appointment was spent counseling and discussing diagnosis and management of symptoms with the parent/patient.  I discussed the assessment and treatment plan with the parent. The parent/patient was provided an opportunity to ask questions and all were answered. The parent/patient agreed with the plan and demonstrated an understanding of the instructions.   The parent/patient was advised to call back or seek an  in-person evaluation if the symptoms worsen or if the condition fails to improve as anticipated.  I provided 25 minutes of non-face-to-face time during this encounter.   Completed record review for 0 minutes prior to the virtual video visit.   Andrew Penna, NP  Counseling Time: 25 minutes   Total Contact Time: 25 minutes

## 2018-11-16 NOTE — Patient Instructions (Signed)
DISCUSSION: Counseled regarding the following coordination of care items:  Continue medication as directed Zoloft 50 mg daily Evekeo 20 mg twice daily Intuniv 3 mg daily RX for above e-scribed and sent to pharmacy on record  [place], [place] - [address] [place] [address] [place] [address] Phone: [phone] Fax: [phone]  Counseled medication administration, effects, and possible side effects.  ADHD medications discussed to include different medications and pharmacologic properties of each. Recommendation for specific medication to include dose, administration, expected effects, possible side effects and the risk to benefit ratio of medication management.  Advised importance of:  Good sleep hygiene (8- 10 hours per night)  Limited screen time (none on school nights, no more than 2 hours on weekends)  Regular exercise(outside and active play)  Healthy eating (drink water, no sodas/sweet tea)  Regular family meals have been linked to lower levels of adolescent risk-taking behavior.  Adolescents who frequently eat meals with their family are less likely to engage in risk behaviors than those who never or rarely eat with their families.  So it is never too early to start this tradition.

## 2018-12-01 IMAGING — DX DG KNEE 1-2V*R*
1 series · 1 of 1 positions shown · non-contrast
Comparison: Right knee images obtained earlier in the day

CLINICAL DATA: Pain

EXAM:
RIGHT KNEE - 1-2 VIEW

[knee (tunnel view) ap]
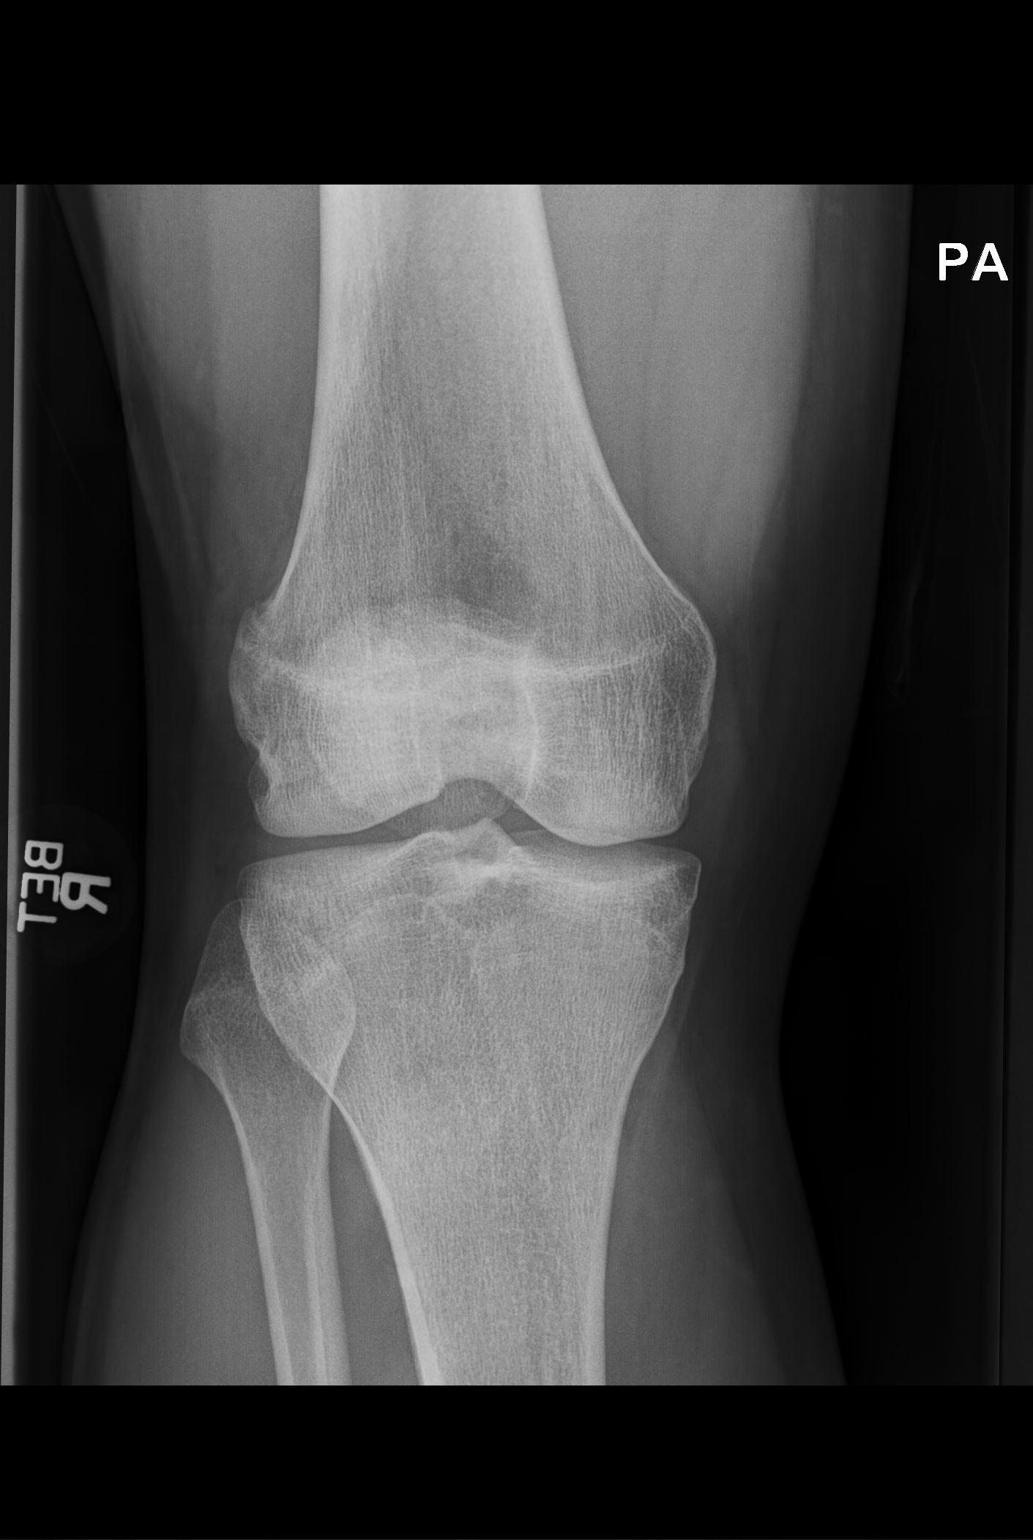

[1 of 1 positions shown; findings below may reference images not displayed]

FINDINGS: Tunnel view right knee obtained. No fracture or dislocation. Joint
spaces unremarkable. No erosive change or intra-articular
calcification.
IMPRESSION: No abnormality appreciable.

## 2019-02-06 ENCOUNTER — Other Ambulatory Visit: Payer: Self-pay | Admitting: Pediatrics

## 2019-02-07 NOTE — Telephone Encounter (Signed)
Last visit 11/16/2018 next visit 02/10/2019

## 2019-02-07 NOTE — Telephone Encounter (Signed)
RX for above e-scribed and sent to pharmacy on record  [id number] - [place], [place] - [address] [place] [address] Phone: [phone] Fax: [phone]   

## 2019-02-10 ENCOUNTER — Other Ambulatory Visit: Payer: Self-pay

## 2019-02-10 ENCOUNTER — Ambulatory Visit (INDEPENDENT_AMBULATORY_CARE_PROVIDER_SITE_OTHER): Payer: 59 | Admitting: Pediatrics

## 2019-02-10 ENCOUNTER — Encounter: Payer: Self-pay | Admitting: Pediatrics

## 2019-02-10 DIAGNOSIS — Z79899 Other long term (current) drug therapy: Secondary | ICD-10-CM

## 2019-02-10 DIAGNOSIS — Z7189 Other specified counseling: Secondary | ICD-10-CM

## 2019-02-10 DIAGNOSIS — F411 Generalized anxiety disorder: Secondary | ICD-10-CM | POA: Diagnosis not present

## 2019-02-10 DIAGNOSIS — R278 Other lack of coordination: Secondary | ICD-10-CM | POA: Diagnosis not present

## 2019-02-10 DIAGNOSIS — F900 Attention-deficit hyperactivity disorder, predominantly inattentive type: Secondary | ICD-10-CM | POA: Diagnosis not present

## 2019-02-10 DIAGNOSIS — Z719 Counseling, unspecified: Secondary | ICD-10-CM

## 2019-02-10 MED ORDER — SERTRALINE HCL 100 MG PO TABS: 50.0000 mg | ORAL_TABLET | Freq: Every morning | ORAL | 2 refills | Status: DC

## 2019-02-10 MED ORDER — EVEKEO ODT 20 MG PO TBDP: 20.0000 mg | ORAL_TABLET | Freq: Two times a day (BID) | ORAL | 0 refills | Status: DC

## 2019-02-10 NOTE — Progress Notes (Signed)
[facility] [place]. [address] [place] [address] Dept: [phone] Dept Fax: [phone]  Medication Check by FaceTime due to COVID-19  Patient ID:  Andrew Christian  male DOB: 2001-01-03   19 y.o.   MRN: [id number]   DATE:02/10/19  PCP: Tammi Sou, MD  Interviewed: Andrew Christian  Location: Their home Provider location: Provider's private residence, no others present  Virtual Visit via Video Note Connected with Andrew Christian on 02/10/19 at  2:00 PM EST by video enabled telemedicine application and verified that I am speaking with the correct person using two identifiers.     I discussed the limitations, risks, security and privacy concerns of performing an evaluation and management service by telephone and the availability of in person appointments. I also discussed with the parent/patient that there may be a patient responsible charge related to this service. The parent/patient expressed understanding and agreed to proceed.  HISTORY OF PRESENT ILLNESS/CURRENT STATUS: Andrew Christian is being followed for medication management for ADHD, dysgraphia and anxiety.   Last visit on 11/16/2018  Andrew Christian currently prescribed Evekeo 20 mg every day, Intuniv 3 mg at bedtime and Zoloft 50 mg Behaviors: did recently increase to full Zoloft 50 mg due to some anxious lately and running trains of thoughts.  May have been due to end of school, when school was done. Enjoys being home but it is more of a stress at home.  Eating well (eating breakfast, lunch and dinner).   Sleeping: bedtime variable and off schedule 03-298 and awake by noon.  Sleeping through the night.   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   Activities/ Exercise: daily  Screen time: (phone, tablet, TV, computer): non-essential, not excessive although up late  MEDICAL HISTORY: Individual Medical History/ Review of Systems: Changes? :No No COVID or exposures, has an allergy flare currently.  Did visit girlfriend with a cat. Family Medical/ Social History: Changes? No   Patient Lives with: mother and father  Sisters 47 and 7 and brother 15 years  Current Medications:  Evekeo 20 mg every morning Intuniv 3 mg at bedtime Zoloft (sertraline) 50 mg every morning   Medication Side Effects: None  MENTAL HEALTH: Mental Health Issues:    Denies sadness, loneliness or depression. No self harm or thoughts of self harm or injury. Denies fears, worries and anxieties. Has good peer relations and is not a bully nor is victimized. Coping doing well, sleep schedule is off and more anxiety feelings. Counseled sleep, exercise, diet and anxiety   DIAGNOSES:    ICD-10-CM   1. Attention deficit hyperactivity disorder (ADHD), predominantly inattentive type  F90.0   2. Dysgraphia  R27.8   3. GAD (generalized anxiety disorder)  F41.1   4. Medication management  Z79.899   5. Patient counseled  Z71.9   6. Counseling and coordination of care  Z71.89      RECOMMENDATIONS:  Patient Instructions  DISCUSSION: Counseled regarding the following coordination of care items:  Continue medication as directed Evekeo 20 mg every morning Intuniv 3  mg at bedtime Zoloft (sertraline) 50 mg every morning RX for above e-scribed and sent to pharmacy on record  [id number] - [place], [place] - [address] [place] [address] Phone: [phone] Fax:  [phone]     Counseled medication administration, effects, and possible side effects.  ADHD medications discussed to include different medications and pharmacologic properties of each. Recommendation for specific medication to include dose, administration, expected effects, possible side effects and the risk to benefit ratio of medication management.  Advised importance of:  Good sleep hygiene (8- 10 hours per night) Reset sleep schedule.  Asleep no later than 2400 and awake by 0900  Limited screen time (none on school nights, no more than 2 hours on weekends)  Regular exercise(outside and active play)  Healthy eating (drink water, no sodas/sweet tea)  Regular family meals have been linked to lower levels of adolescent risk-taking behavior.  Adolescents who frequently eat meals with their family are less likely to engage in risk behaviors than those who never or rarely eat with their families.  So it is never too early to start this tradition.  Counseling at this visit included the review of old records and/or current chart.   Counseling included the following discussion points presented at every visit to improve understanding and treatment compliance.  Recent health history and today's examination Growth and development with anticipatory guidance provided regarding brain growth, executive function maturation and pre or pubertal development. School progress and continued advocay for appropriate accommodations to include maintain Structure, routine, organization, reward, motivation and consequences.  Additionally the patient was counseled to take medication while driving.          Discussed continued need for routine, structure, motivation, reward and positive reinforcement  Encouraged recommended limitations on TV, tablets, phones, video games and computers for non-educational activities.  Encouraged physical activity and outdoor play, maintaining social distancing.   Discussed how to talk to anxious children about coronavirus.   Referred to ADDitudemag.com for resources about engaging children who are at home in home and online study.    NEXT APPOINTMENT:  Return in about 3 months (around 05/11/2019) for Medication Check. Please call the office for a sooner appointment if problems arise.  Medical Decision-making: More than 50% of the appointment was spent counseling and discussing diagnosis and management of symptoms with the parent/patient.  I discussed the assessment and treatment plan with the parent. The parent/patient was provided an opportunity to ask questions and all were answered. The parent/patient agreed with the plan and demonstrated an understanding of the instructions.   The parent/patient was advised to call back or seek an in-person evaluation if the symptoms worsen or if the condition fails to improve as anticipated.  I provided 25 minutes of non-face-to-face time during this encounter.   Completed record review for 0 minutes prior to the virtual video visit.   Leticia Penna, NP  Counseling Time: 25 minutes   Total Contact Time: 25 minutes

## 2019-02-10 NOTE — Patient Instructions (Signed)
DISCUSSION: Counseled regarding the following coordination of care items:  Continue medication as directed Evekeo 20 mg every morning Intuniv 3 mg at bedtime Zoloft (sertraline) 50 mg every morning RX for above e-scribed and sent to pharmacy on record  [id number] - [place], [place] - [address] [place] [address] Phone: [phone] Fax: [phone]     Counseled medication administration, effects, and possible side effects.  ADHD medications discussed to include different medications and pharmacologic properties of each. Recommendation for specific medication to include dose, administration, expected effects, possible side effects and the risk to benefit ratio of medication management.  Advised importance of:  Good sleep hygiene (8- 10 hours per night) Reset sleep schedule.  Asleep no later than 2400 and awake by 0900  Limited screen time (none on school nights, no more than 2 hours on weekends)  Regular exercise(outside and active play)  Healthy eating (drink water, no sodas/sweet tea)  Regular family meals have been linked to lower levels of adolescent risk-taking behavior.  Adolescents who frequently eat meals with their family are less likely to engage in risk behaviors than those who never or rarely eat with their families.  So it is never too early to start this tradition.  Counseling at this visit included the review of old records and/or current chart.   Counseling included the following discussion points presented at every visit to improve understanding and treatment compliance.  Recent health history and today's examination Growth and development with anticipatory guidance provided regarding brain growth, executive function maturation and pre or pubertal development. School progress and continued advocay for appropriate accommodations to include maintain Structure, routine, organization, reward,  motivation and consequences.  Additionally the patient was counseled to take medication while driving.

## 2019-02-14 ENCOUNTER — Other Ambulatory Visit: Payer: Self-pay

## 2019-02-14 MED ORDER — EVEKEO ODT 20 MG PO TBDP: 20.0000 mg | ORAL_TABLET | Freq: Two times a day (BID) | ORAL | 0 refills | Status: DC

## 2019-02-14 MED ORDER — SERTRALINE HCL 100 MG PO TABS: 50.0000 mg | ORAL_TABLET | Freq: Every morning | ORAL | 2 refills | Status: DC

## 2019-02-14 NOTE — Telephone Encounter (Signed)
E-Prescribed Zoloft and Evekeo ODT directly to  [id number] - [place], [place] - [address] [place] [address] Phone: [phone] Fax: [phone]

## 2019-02-14 NOTE — Telephone Encounter (Signed)
Mom called in that Andrew Christian and Zolft were supposed to be sent to [facility] [place] [facility]

## 2019-02-24 ENCOUNTER — Other Ambulatory Visit: Payer: Self-pay

## 2019-02-24 MED ORDER — EVEKEO ODT 20 MG PO TBDP: 20.0000 mg | ORAL_TABLET | Freq: Two times a day (BID) | ORAL | 0 refills | Status: DC

## 2019-02-24 NOTE — Telephone Encounter (Signed)
Mom called in and would like Evekeo sent to [place] instead of [address] in [place]

## 2019-02-24 NOTE — Telephone Encounter (Signed)
RX for above e-scribed and sent to pharmacy on record  [facility] - [place], [place] - [address] [place] [address] Phone: [phone] Fax: [phone]    

## 2019-03-30 ENCOUNTER — Other Ambulatory Visit: Payer: Self-pay | Admitting: Pediatrics

## 2019-03-30 NOTE — Telephone Encounter (Signed)
RX was sent in on 02/14/2019 with 2 RFs

## 2019-04-08 ENCOUNTER — Telehealth: Payer: Self-pay | Admitting: Pediatrics

## 2019-04-08 MED ORDER — EVEKEO ODT 20 MG PO TBDP: 20.0000 mg | ORAL_TABLET | Freq: Two times a day (BID) | ORAL | 0 refills | Status: DC

## 2019-04-08 NOTE — Telephone Encounter (Signed)
RX for above e-scribed and sent to pharmacy on record  [facility] - [place], [place] - [address] [place]  [address] Phone: [phone] Fax: [phone]   

## 2019-04-08 NOTE — Telephone Encounter (Signed)
Last visit: 02/10/2019.

## 2019-04-12 ENCOUNTER — Other Ambulatory Visit: Payer: Self-pay | Admitting: Pediatrics

## 2019-04-12 MED ORDER — EVEKEO ODT 20 MG PO TBDP: 20.0000 mg | ORAL_TABLET | Freq: Two times a day (BID) | ORAL | 0 refills | Status: DC

## 2019-04-12 NOTE — Telephone Encounter (Signed)
Mother requested send to new pharmacy RX for above e-scribed and sent to pharmacy on record  [id number] - [place], [place] - [address] [facility] [address] [place] [address] Phone: [phone] Fax: [phone]   Not available at [facility]

## 2019-05-08 ENCOUNTER — Other Ambulatory Visit: Payer: Self-pay | Admitting: Pediatrics

## 2019-05-09 NOTE — Telephone Encounter (Signed)
RX for above e-scribed and sent to pharmacy on record  [id number] - [place], [place] - [address] [place] [address] Phone: [phone] Fax: [phone]   

## 2019-06-09 ENCOUNTER — Telehealth (INDEPENDENT_AMBULATORY_CARE_PROVIDER_SITE_OTHER): Payer: 59 | Admitting: Pediatrics

## 2019-06-09 ENCOUNTER — Other Ambulatory Visit: Payer: Self-pay

## 2019-06-09 ENCOUNTER — Encounter: Payer: Self-pay | Admitting: Pediatrics

## 2019-06-09 DIAGNOSIS — Z79899 Other long term (current) drug therapy: Secondary | ICD-10-CM

## 2019-06-09 DIAGNOSIS — R278 Other lack of coordination: Secondary | ICD-10-CM

## 2019-06-09 DIAGNOSIS — F900 Attention-deficit hyperactivity disorder, predominantly inattentive type: Secondary | ICD-10-CM | POA: Diagnosis not present

## 2019-06-09 DIAGNOSIS — Z719 Counseling, unspecified: Secondary | ICD-10-CM

## 2019-06-09 DIAGNOSIS — F411 Generalized anxiety disorder: Secondary | ICD-10-CM | POA: Diagnosis not present

## 2019-06-09 DIAGNOSIS — Z7189 Other specified counseling: Secondary | ICD-10-CM

## 2019-06-09 MED ORDER — SERTRALINE HCL 100 MG PO TABS: 50.0000 mg | ORAL_TABLET | Freq: Every morning | ORAL | 0 refills | Status: DC

## 2019-06-09 MED ORDER — EVEKEO ODT 20 MG PO TBDP: 20.0000 mg | ORAL_TABLET | Freq: Two times a day (BID) | ORAL | 0 refills | Status: DC

## 2019-06-09 NOTE — Patient Instructions (Signed)
DISCUSSION: Counseled regarding the following coordination of care items:  Continue medication as directed Evekeo 20 mg BID Zoloft 100 mg 1/2 to one daily Intuniv 3 mg daily RX for above e-scribed and sent to pharmacy on record  [id number] - [place], [place] - [address] [place] [address] Phone: [phone] Fax: [phone]  Counseled regarding obtaining refills by calling pharmacy first to use automated refill request then if needed, call our office leaving a detailed message on the refill line.  Counseled medication administration, effects, and possible side effects.  ADHD medications discussed to include different medications and pharmacologic properties of each. Recommendation for specific medication to include dose, administration, expected effects, possible side effects and the risk to benefit ratio of medication management.  Advised importance of:  Good sleep hygiene (8- 10 hours per night)  Limited screen time (none on school nights, no more than 2 hours on weekends)  Regular exercise(outside and active play)  Healthy eating (drink water, no sodas/sweet tea)Counseling at this visit included the review of old records and/or current chart.   Counseling included the following discussion points presented at every visit to improve understanding and treatment compliance.  Recent health history and today's examination Growth and development with anticipatory guidance provided regarding brain growth, executive function maturation and pre or pubertal development. School progress and continued advocay for appropriate accommodations to include maintain Structure, routine, organization, reward, motivation and consequences.  Additionally the patient was counseled to take medication while driving.

## 2019-06-09 NOTE — Progress Notes (Signed)
[facility] [address], [place]. [address] [place] [address] Dept: [phone] Dept Fax: [phone]  Medication Check by Caregility due to COVID-19  Patient ID:  Andrew Christian  male DOB: 2000/10/25   19 y.o.   MRN: [id number]   DATE:06/09/19  PCP: Jeoffrey Massed, MD  Interviewed: Delorise Jackson Location: Apartment Provider location: [facility] office  Virtual Visit via Video Note Connected with Delorise Jackson on 06/09/19 at  9:00 AM EDT by video enabled telemedicine application and verified that I am speaking with the correct person using two identifiers.     I discussed the limitations, risks, security and privacy concerns of performing an evaluation and management service by telephone and the availability of in person appointments. I also discussed with the parent/patient that there may be a patient responsible charge related to this service. The parent/patient expressed understanding and agreed to proceed.  HISTORY OF PRESENT ILLNESS/CURRENT STATUS: Andrew Christian is being followed for medication management for ADHD, dysgraphia and learning differences.   Last visit on 02/10/19  [facility] currently prescribed Evekeo 20 mg BID, Zoloft 100 mg 1/2 to one daily, Intuniv 3 mg daily   Behaviors: had off meds for Guanfacine. And noticed a change.  Everything is working well.  Eating well (eating breakfast, lunch and dinner).   Elimination: no concerns  Sleeping: no concerns and Sleeping through the night.   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  Activities/ Exercise: daily  Has conditioning for Football. Designer, jewellery  Screen time: (phone, tablet, TV, computer): non-essential, not  excesive  MEDICAL HISTORY: Individual Medical History/ Review of Systems: Changes? :No  Family Medical/ Social History: Changes? No   Patient Lives with: mother and father and siblings when in [place]  Current Medications:  Evekeo 20 mg BID Zoloft 100 mg 1/2 to one daily Intuniv 3 mg daily  Medication Side Effects: None  MENTAL HEALTH: Mental Health Issues:    Denies sadness, loneliness or depression. No self harm or thoughts of self harm or injury. Denies fears, worries and anxieties. Has good peer relations and is not a bully nor is victimized. Coping doing well  DIAGNOSES:    ICD-10-CM   1. Attention deficit hyperactivity disorder (ADHD), predominantly inattentive type  F90.0   2. Dysgraphia  R27.8   3. GAD (generalized anxiety disorder)  F41.1   4. Medication management  Z79.899   5. Patient counseled  Z71.9   6. Parenting dynamics counseling  Z71.89   7. Counseling and coordination of care  Z71.89      RECOMMENDATIONS:  Patient Instructions  DISCUSSION: Counseled regarding the following coordination of care items:  Continue medication as directed Evekeo 20 mg BID Zoloft 100 mg 1/2 to one daily Intuniv 3 mg daily RX for above e-scribed and sent to pharmacy on record  [id number] - [place],  - [address] [place] [address] Phone: [phone] Fax: [phone]  Counseled regarding obtaining refills by calling pharmacy first to use automated refill request then if needed, call our office leaving a detailed message on the refill line.  Counseled medication administration, effects, and possible side effects.  ADHD medications discussed to include different medications and pharmacologic properties of each.  Recommendation for specific medication to include dose, administration, expected effects, possible side effects and the risk to benefit ratio of medication management.  Advised  importance of:  Good sleep hygiene (8- 10 hours per night)  Limited screen time (none on school nights, no more than 2 hours on weekends)  Regular exercise(outside and active play)  Healthy eating (drink water, no sodas/sweet tea)Counseling at this visit included the review of old records and/or current chart.   Counseling included the following discussion points presented at every visit to improve understanding and treatment compliance.  Recent health history and today's examination Growth and development with anticipatory guidance provided regarding brain growth, executive function maturation and pre or pubertal development. School progress and continued advocay for appropriate accommodations to include maintain Structure, routine, organization, reward, motivation and consequences.  Additionally the patient was counseled to take medication while driving.         Discussed continued need for routine, structure, motivation, reward and positive reinforcement  Encouraged recommended limitations on TV, tablets, phones, video games and computers for non-educational activities.  Encouraged physical activity and outdoor play, maintaining social distancing.   Referred to ADDitudemag.com for resources about ADHD, engaging children who are at home in home and online study.    NEXT APPOINTMENT:  Return in about 3 months (around 09/09/2019) for Medical Follow up. Please call the office for a sooner appointment if problems arise.  Medical Decision-making: More than 50% of the appointment was spent counseling and discussing diagnosis and management of symptoms with the parent/patient.  I discussed the assessment and treatment plan with the parent. The parent/patient was provided an opportunity to ask questions and all were answered. The parent/patient agreed with the plan and demonstrated an understanding of the instructions.   The parent/patient was advised to call back or seek an in-person  evaluation if the symptoms worsen or if the condition fails to improve as anticipated.  I provided 25 minutes of non-face-to-face time during this encounter.   Completed record review for 0 minutes prior to the virtual video visit.   Len Childs, NP  Counseling Time: 25 minutes   Total Contact Time: 25 minutes

## 2019-06-24 ENCOUNTER — Other Ambulatory Visit: Payer: Self-pay

## 2019-06-24 MED ORDER — EVEKEO ODT 20 MG PO TBDP: 20.0000 mg | ORAL_TABLET | Freq: Two times a day (BID) | ORAL | 0 refills | Status: DC

## 2019-06-24 NOTE — Telephone Encounter (Signed)
Pharm faxed in that Evekeo ODT is on backorder, spoke with mom and she is fine with sending med to [employer].

## 2019-06-24 NOTE — Telephone Encounter (Signed)
RX for above e-scribed and sent to pharmacy on record  [facility] - [place], [place] - [address] [place] [address] Phone: [phone] Fax: [phone]   

## 2019-06-29 ENCOUNTER — Ambulatory Visit: Payer: Self-pay | Admitting: Family Medicine

## 2019-07-06 ENCOUNTER — Ambulatory Visit: Payer: Self-pay | Admitting: Family Medicine

## 2019-07-07 ENCOUNTER — Encounter: Payer: Self-pay | Admitting: Pediatrics

## 2019-07-18 ENCOUNTER — Ambulatory Visit (INDEPENDENT_AMBULATORY_CARE_PROVIDER_SITE_OTHER): Payer: 59 | Admitting: Family Medicine

## 2019-07-18 ENCOUNTER — Ambulatory Visit (INDEPENDENT_AMBULATORY_CARE_PROVIDER_SITE_OTHER): Payer: 59

## 2019-07-18 ENCOUNTER — Ambulatory Visit: Payer: Self-pay

## 2019-07-18 ENCOUNTER — Other Ambulatory Visit: Payer: Self-pay

## 2019-07-18 ENCOUNTER — Encounter: Payer: Self-pay | Admitting: Family Medicine

## 2019-07-18 VITALS — BP 130/90 | HR 83 | Ht 71.0 in | Wt 226.0 lb

## 2019-07-18 DIAGNOSIS — M25512 Pain in left shoulder: Secondary | ICD-10-CM

## 2019-07-18 DIAGNOSIS — M542 Cervicalgia: Secondary | ICD-10-CM | POA: Diagnosis not present

## 2019-07-18 NOTE — Patient Instructions (Signed)
Thank you for coming in today. Get xray today.  Plan for PT.  Recheck in 6 weeks. Let me know sooner if not doing well.

## 2019-07-18 NOTE — Progress Notes (Signed)
    Subjective:    CC: Left Shoulder injury   I, Debbe Odea, am serving as a scribe for Dr. Clementeen Graham.  HPI: patient is a 19 year old male presenting to [facility] today for a shoulder injury from playing football. States has been having stinger pain in L shoulder during football season. Has been worse this season describes pain as dull. Radiates up neck and down the arm. Has been to a chiropractor but has not helped, does use advil. Locates pain to above elbow below shoulder.  Pain radiates down his arm to his pinky finger.  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   Objective:    Vitals:   07/18/19 1249  BP: 130/90  Pulse: 83  SpO2: 99%   General: Well Developed, well nourished, and in no acute distress.   MSK: C-spine: Normal-appearing Nontender. Normal motion. Positive left-sided Spurling's test. Upper extremity strength reflexes and sensation are equal normal throughout. Left shoulder normal-appearing nontender normal shoulder motion.  Normal strength negative Hawkins and Neer's test.  Negative empty can test. Negative Yergason's and speeds test.  Lab and Radiology Results X-ray cervical spine images obtained today personally and independently reviewed No acute fractures.  Mild spondylosis present. Await formal radiology review   Impression and Recommendations:    Assessment and Plan: 19 y.o. male with left arm pain.  Concerning for cervical radiculopathy at C8.  Patient may have a brachial plexus traction injury also known to stinger causing some of his pain.  Doubtful for true shoulder issue.  Await radiology over read x-rays today.  Will refer to physical therapy and check back in 4 to 6 weeks.  If not better likely proceed with cervical spine MRI..   Orders Placed This Encounter  Procedures  . DG Cervical Spine Complete      Standing Status:   Future    Number of Occurrences:   1    Standing Expiration Date:   07/17/2020    Order Specific Question:   Reason for Exam (SYMPTOM  OR DIAGNOSIS REQUIRED)    Answer:   eval neck pain and possibly C8 radiculopathy    Order Specific Question:   Preferred imaging location?    Answer:   [name]    Order Specific Question:   Radiology Contrast Protocol - do NOT remove file path    Answer:   [email or web address]  . Ambulatory referral to Physical Therapy    Referral Priority:   Routine    Referral Type:   Physical Medicine    Referral Reason:   Specialty Services Required    Requested Specialty:   Physical Therapy    Number of Visits Requested:   1   No orders of the defined types were placed in this encounter.   Discussed warning signs or symptoms. Please see discharge instructions. Patient expresses understanding.   The above documentation has been reviewed and is accurate and complete Clementeen Graham, M.D.

## 2019-07-19 NOTE — Progress Notes (Signed)
Cervical spine x-ray is normal.  We will proceed to MRI if not improved with physical therapy.

## 2019-08-07 ENCOUNTER — Other Ambulatory Visit: Payer: Self-pay | Admitting: Pediatrics

## 2019-08-09 NOTE — Telephone Encounter (Signed)
Intuniv 3 mg daily # 90 with no RF's.RX for above e-scribed and sent to pharmacy on record  [facility] #[id number] - [place], [place] - [address] [place] [address] Phone: [phone] Fax: [phone]

## 2019-09-05 ENCOUNTER — Encounter: Payer: Self-pay | Admitting: Family Medicine

## 2019-09-05 ENCOUNTER — Other Ambulatory Visit: Payer: Self-pay

## 2019-09-05 ENCOUNTER — Ambulatory Visit (INDEPENDENT_AMBULATORY_CARE_PROVIDER_SITE_OTHER): Payer: 59 | Admitting: Family Medicine

## 2019-09-05 VITALS — BP 102/76 | HR 67 | Ht 71.0 in | Wt 233.0 lb

## 2019-09-05 DIAGNOSIS — M542 Cervicalgia: Secondary | ICD-10-CM

## 2019-09-05 NOTE — Progress Notes (Signed)
   I, Christoper Fabian, LAT, ATC, am serving as scribe for Dr. Clementeen Graham.  Andrew Christian is a 19 y.o. male who presents to [facility] today for f/u of neck and L shoulder/arm pain.  He was last seen by Dr. [name] on 07/18/19 and was referred to [facility] PT.  Since his last visit, pt reports that his symptoms have been up/down since his last visit but feels like it's better overall.  He has completed several PT sessions. Today, he reports the majority of his symptoms in his neck and L scapula. He plays football for [employer] in [place].  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  Overall he feels much better.  Diagnostic testing: Neck XR- 07/18/19   Pertinent review of systems: No fevers or chills  Relevant historical information: Football player   Exam:  BP 102/76 (BP Location: Right Arm, Patient Position: Sitting, Cuff Size: Large)   Pulse 67   Ht 5\' 11"  (1.803 m)   Wt 233 lb (105.7 kg)   SpO2 98%   BMI 32.50 kg/m  General: Well Developed, well nourished, and in no acute distress.   MSK: C-spine normal-appearing normal motion nontender. Impression me strength reflexes and sensation are intact.    Lab and Radiology Results DG Cervical Spine Complete  Result Date: 07/18/2019 CLINICAL DATA:  Left shoulder pain EXAM: CERVICAL SPINE - COMPLETE 4+ VIEW COMPARISON:  None. FINDINGS: There is no evidence of cervical spine fracture or prevertebral soft tissue swelling. Alignment is normal. No other significant bone abnormalities are identified. IMPRESSION: Negative cervical spine radiographs. Electronically Signed   By: 07/20/2019 M.D.   On: 07/18/2019 20:24   I, 07/20/2019, personally (independently) visualized and performed the interpretation of the images attached in this note.    Assessment and Plan: 19 y.o. male with left shoulder and arm pain.  Significant improvement with limited physical therapy.  He is almost completely improved.  He has excellent strength  today to very heavy duty strength testing in clinic.  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 Next step in my opinion would be MRI however symptoms at this point likely not warranted.  If the ATC or team physician has questions I am available via text at my cell phone [phone]     Discussed warning signs or symptoms. Please see discharge instructions. Patient expresses understanding.   The above documentation has been reviewed and is accurate and complete [phone], M.D.

## 2019-09-05 NOTE — Patient Instructions (Signed)
Thank you for coming in today. Make sure the team doctor and trainer know what happened.  Show them my notes and the notes from PT.  I am happy to communicate with the team doc or trainer.  Recheck with me as needed. Ok to communicate as needed.  Mychart is great for formal communication.

## 2019-10-07 ENCOUNTER — Telehealth: Payer: Self-pay | Admitting: Pediatrics

## 2019-10-07 NOTE — Telephone Encounter (Signed)
RX for above e-scribed and sent to pharmacy on record  [id number] - [place], [place] - [address] [place] [address] Phone: [phone] Fax: [phone]   

## 2019-10-07 NOTE — Telephone Encounter (Signed)
Last visit 06/09/2019

## 2019-12-01 ENCOUNTER — Other Ambulatory Visit: Payer: Self-pay | Admitting: Family

## 2019-12-01 NOTE — Telephone Encounter (Signed)
Last visit 06/09/2019 next visit 02/01/2020

## 2019-12-02 NOTE — Telephone Encounter (Signed)
RX for above e-scribed and sent to pharmacy on record  [id number] - [place], [place] - [address] [place] [address] Phone: [phone] Fax: [phone]   

## 2019-12-28 ENCOUNTER — Other Ambulatory Visit: Payer: Self-pay

## 2019-12-28 MED ORDER — EVEKEO ODT 20 MG PO TBDP: 20.0000 mg | ORAL_TABLET | Freq: Two times a day (BID) | ORAL | 0 refills | Status: DC

## 2019-12-28 NOTE — Telephone Encounter (Signed)
E-Prescribed Evekeo ODt 20 directly to  [facility] #[id number] - [place], [place] - [address] [place] [address] Phone: [phone] Fax: [phone]

## 2019-12-28 NOTE — Telephone Encounter (Signed)
Mom called in for refill for Evekeo ODT. Last visit 06/09/2019 next visit 02/01/2020. Please escribe to [facility] in [place], [place]

## 2020-01-24 ENCOUNTER — Other Ambulatory Visit: Payer: Self-pay

## 2020-01-25 ENCOUNTER — Encounter: Payer: Self-pay | Admitting: Family Medicine

## 2020-01-25 ENCOUNTER — Ambulatory Visit (INDEPENDENT_AMBULATORY_CARE_PROVIDER_SITE_OTHER): Payer: 59 | Admitting: Family Medicine

## 2020-01-25 VITALS — BP 100/60 | HR 66 | Temp 97.4°F | Resp 16 | Ht 71.0 in | Wt 235.4 lb

## 2020-01-25 DIAGNOSIS — F902 Attention-deficit hyperactivity disorder, combined type: Secondary | ICD-10-CM

## 2020-01-25 DIAGNOSIS — F411 Generalized anxiety disorder: Secondary | ICD-10-CM | POA: Diagnosis not present

## 2020-01-25 DIAGNOSIS — Z23 Encounter for immunization: Secondary | ICD-10-CM | POA: Diagnosis not present

## 2020-01-25 MED ORDER — EVEKEO ODT 20 MG PO TBDP: 20.0000 mg | ORAL_TABLET | Freq: Two times a day (BID) | ORAL | 0 refills | Status: DC

## 2020-01-25 MED ORDER — SERTRALINE HCL 100 MG PO TABS: ORAL_TABLET | ORAL | 3 refills | Status: DC

## 2020-01-25 MED ORDER — GUANFACINE HCL ER 3 MG PO TB24: ORAL_TABLET | ORAL | 3 refills | Status: DC

## 2020-01-25 NOTE — Progress Notes (Signed)
Office Note 01/25/2020  CC:  Chief Complaint  Patient presents with  . Discuss ADHD   HPI:  Andrew Christian is a 19 y.o. male who is here to discuss ADHD. Feeling well, no acute complaints.  He asks for me to take over mgmt of his ADHD and anxiety since he has "aged out" of his prior provider for this. He has been followed long term by [facility] for ADHD and anxiety. Most recent visit was 06/09/19, reviewed in EMR today. He has been on intuniv 3mg  qhs, evekio 20mg  bid, and 1/2 of 100mg  sertraline daily for a couple years or so w/out any dose changes and is doing well. Pt states all is going well with the med at current dosing: much improved focus, concentration, task completion.  Less frustration, better multitasking, less impulsivity and restlessness.  Mood is stable.  Anxiety level is fine, no hx of panic. No side effects from the medication. He is finishing up BA at [employer] in 2022 and will stay an additional year to complete a Masters deg in supply chain mgmt.  I saw him 08/13/18 for excessive daytime sleepiness and referred him to neurology for eval/consideration of sleep study.  He was seen and dx'd with deviated septum, no sleep study done.  Pt states he is no longer having any problems with regard this this.  ROS: no fevers, no CP, no SOB, no wheezing, no cough, no dizziness, no HAs, no rashes, no melena/hematochezia.  No polyuria or polydipsia.  No myalgias or arthralgias.  No focal weakness, paresthesias, or tremors.  No acute vision or hearing abnormalities. No n/v/d or abd pain.  No palpitations.     Past Medical History:  Diagnosis Date  . ADHD (attention deficit hyperactivity disorder)    managed by psych  . Anxiety    managed by psych  . Medial meniscus tear 07/2016   right knee  . Obesity, Class I, BMI 30-34.9     Past Surgical History:  Procedure Laterality Date  . KNEE ARTHROSCOPY Bilateral     Family History   Problem Relation Age of Onset  . Thyroid disease Mother   . High Cholesterol Father   . ADD / ADHD Sister   . Thyroid disease Maternal Grandmother   . Heart disease Maternal Grandfather   . Heart attack Maternal Grandfather   . High Cholesterol Maternal Grandfather   . Depression Paternal Grandmother     Social History   Socioeconomic History  . Marital status: Single    Spouse name: Not on file  . Number of children: Not on file  . Years of education: Not on file  . Highest education level: Not on file  Occupational History  . Not on file  Tobacco Use  . Smoking status: Never Smoker  . Smokeless tobacco: Never Used  Vaping Use  . Vaping Use: Never used  Substance and Sexual Activity  . Alcohol use: No    Alcohol/week: 0.0 standard drinks  . Drug use: No  . Sexual activity: Never  Other Topics Concern  . Not on file  Social History Narrative   [employer] grad.   [employer] student ([place], 10/14/18), majoring in finance.   Fraternity.   Alc: social.   Tob: none   No drugs.   Social Determinants of Health   Financial Resource Strain: Not on file  Food Insecurity: Not on file  Transportation Needs: Not on file  Physical Activity: Not on file  Stress: Not on file  Social Connections: Not on file  Intimate Partner Violence: Not on file    Outpatient Encounter Medications as of 01/25/2020  Medication Sig  . [DISCONTINUED] Amphetamine Sulfate (EVEKEO ODT) 20 MG TBDP Take 20 mg by mouth 2 (two) times daily.  . [DISCONTINUED] GuanFACINE HCl 3 MG TB24 TAKE 1 TABLET(3 MG) BY MOUTH DAILY  . [DISCONTINUED] sertraline (ZOLOFT) 100 MG tablet TAKE 1/2 TO 1 TABLET(50 TO 100 MG) BY MOUTH EVERY MORNING  . Amphetamine Sulfate (EVEKEO ODT) 20 MG TBDP Take 20 mg by mouth 2 (two) times daily.  . GuanFACINE HCl 3 MG TB24 TAKE 1 TABLET(3 MG) BY MOUTH DAILY  . sertraline (ZOLOFT) 100 MG tablet TAKE 1/2 TO 1 TABLET(50 TO 100 MG) BY MOUTH EVERY MORNING  . [DISCONTINUED] Amphetamine  Sulfate (EVEKEO ODT) 20 MG TBDP Take 20 mg by mouth 2 (two) times daily.  . [DISCONTINUED] Amphetamine Sulfate (EVEKEO ODT) 20 MG TBDP Take 20 mg by mouth 2 (two) times daily.   No facility-administered encounter medications on file as of 01/25/2020.    No Known Allergies  PE; Blood pressure 100/60, pulse 66, temperature (!) 97.4 F (36.3 C), temperature source Oral, resp. rate 16, height 5\' 11"  (1.803 m), weight 235 lb 6.4 oz (106.8 kg), SpO2 100 %. Body mass index is 32.83 kg/m.  Wt Readings from Last 2 Encounters:  01/25/20 235 lb 6.4 oz (106.8 kg)  09/05/19 233 lb (105.7 kg)    Gen: alert, oriented x 4, affect pleasant.  Lucid thinking and conversation noted. HEENT: PERRLA, EOMI.   Neck: no LAD, mass, or thyromegaly. CV: RRR, no m/r/g LUNGS: CTA bilat, nonlabored. NEURO: no tremor or tics noted on observation.  Coordination intact. CN 2-12 grossly intact bilaterally, strength 5/5 in all extremeties.  No ataxia.  Pertinent labs:  Lab Results  Component Value Date   TSH 1.81 08/13/2018   Lab Results  Component Value Date   WBC 4.9 08/13/2018   HGB 15.8 08/13/2018   HCT 46.1 08/13/2018   MCV 87.7 08/13/2018   PLT 262.0 08/13/2018   Lab Results  Component Value Date   CREATININE 1.47 08/13/2018   BUN 20 08/13/2018   NA 140 08/13/2018   K 4.2 08/13/2018   CL 102 08/13/2018   CO2 30 08/13/2018   Lab Results  Component Value Date   ALT 16 08/13/2018   AST 25 08/13/2018   ALKPHOS 66 08/13/2018   BILITOT 0.5 08/13/2018    ASSESSMENT AND PLAN:   ADHD, combined-type. GAD. He is well controlled on current regiment of evekeo ODT 20mg  bid, intuniv 3mg  qhs, and 1/2 of 100 mg sertraline qhs. Controlled substance contract reviewed with patient today.  Patient signed this and it will be placed in the chart.   I did electronic rx's for evekeo ODT 20mg , 1 bid, #60 today for each of the next 3 months.  Appropriate fill on/after date was noted on each rx.  An After Visit  Summary was printed and given to the patient.  Return in about 6 months (around 07/25/2020) for f/u ADHD/Anx/med.  Signed:  , MD           01/25/2020

## 2020-01-30 ENCOUNTER — Encounter: Payer: Self-pay | Admitting: Family Medicine

## 2020-01-30 ENCOUNTER — Ambulatory Visit (INDEPENDENT_AMBULATORY_CARE_PROVIDER_SITE_OTHER): Payer: 59 | Admitting: Family Medicine

## 2020-01-30 ENCOUNTER — Other Ambulatory Visit: Payer: Self-pay

## 2020-01-30 ENCOUNTER — Ambulatory Visit (INDEPENDENT_AMBULATORY_CARE_PROVIDER_SITE_OTHER): Payer: 59

## 2020-01-30 VITALS — BP 130/80 | HR 70 | Ht 71.0 in | Wt 233.0 lb

## 2020-01-30 DIAGNOSIS — M25511 Pain in right shoulder: Secondary | ICD-10-CM | POA: Diagnosis not present

## 2020-01-30 DIAGNOSIS — G8929 Other chronic pain: Secondary | ICD-10-CM

## 2020-01-30 NOTE — Patient Instructions (Signed)
Thank you for coming in today.  Please get an Xray today before you leave  I do think you had a shoulder separation (AC joint sprain).   You could have injured the labrum.   If not improving with home exercises and PT like exercises as directed by ATC let me know.   Next step is MRI arthrogram to look for labrum tear in your shoulder.

## 2020-01-30 NOTE — Progress Notes (Signed)
   I, Andrew Christian, LAT, ATC acting as a scribe for Andrew Graham, MD.  Andrew Christian is a 19 y.o. male who presents to [facility] at [facility] today for f/u neck pain. Pt was last seen by Dr. Denyse Christian on  and was advised to resume football and give all medical records to AT/team physician to continue care at college. Patient states he has a new injury today. Right shoulder pain. Was told by his school AT that it was an [facility] joint sprain. Overhead movements and sleeping on the shoulder is pain. Injury was about 5 weeks ago.   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  Dx imaging: 07/18/19 C-spine XR   Pertinent review of systems: No fevers or chills  Relevant historical information: ADHD   Exam:  BP 130/80 (BP Location: Left Arm, Patient Position: Sitting)   Pulse 70   Ht 5\' 11"  (1.803 m)   Wt 233 lb (105.7 kg)   SpO2 99%   BMI 32.50 kg/m  General: Well Developed, well nourished, and in no acute distress.   MSK: Right shoulder normal-appearing Mildly tender palpation AC joint. Normal shoulder motion. Intact strength abduction external/internal rotation. Mildly positive Hawkins and Neer's test. Mildly positive O'Brien test. Mildly positive anterior apprehension test and normal relocation test.    Lab and Radiology Results No results found for this or any previous visit (from the past 72 hour(s)). DG Shoulder Right  Result Date: 01/30/2020 CLINICAL DATA:  Right shoulder pain for 1 month since an injury playing football. Initial encounter. EXAM: RIGHT SHOULDER - 2+ VIEW COMPARISON:  None. FINDINGS: There is no evidence of fracture or dislocation. There is no evidence of arthropathy or other focal bone abnormality. Soft tissues are unremarkable. IMPRESSION: Normal exam. Electronically Signed   By: 02/01/2020 M.D.   On: 01/30/2020 11:34  I, 02/01/2020, personally (independently) visualized and performed the interpretation of  the images attached in this note. My interpretation shows the distal clavicle superior to the acromion concerning for history of shoulder separation     Assessment and Plan: 19 y.o. male with right shoulder pain after shoulder injury occurring 5+ weeks ago. Exam today largely benign.  Somewhat positive for shoulder separation history.  Also could be positive for a labrum injury.  Plan to maximize conservative management with home exercise program at school.  If not improving he will either notify me or his team physician.  At that point would recommend MRI arthrogram.   PDMP not reviewed this encounter. Orders Placed This Encounter  Procedures  . DG Shoulder Right    Standing Status:   Future    Number of Occurrences:   1    Standing Expiration Date:   01/29/2021    Order Specific Question:   Reason for Exam (SYMPTOM  OR DIAGNOSIS REQUIRED)    Answer:   right shoulder pain    Order Specific Question:   Preferred imaging location?    Answer:   01/31/2021   No orders of the defined types were placed in this encounter.    Discussed warning signs or symptoms. Please see discharge instructions. Patient expresses understanding.   The above documentation has been reviewed and is accurate and complete Andrew Christian, M.D.

## 2020-02-01 ENCOUNTER — Institutional Professional Consult (permissible substitution): Payer: 59 | Admitting: Pediatrics

## 2020-03-26 ENCOUNTER — Telehealth: Payer: Self-pay | Admitting: *Deleted

## 2020-03-26 DIAGNOSIS — G8929 Other chronic pain: Secondary | ICD-10-CM

## 2020-03-26 NOTE — Telephone Encounter (Signed)
Pt's mother called stating that the pt's shoulder is still giving him trouble. Per the pt's mother the pt's football coach has suggested that the pt gets an MRI when he's home for spring break which is 3/7-3/10. Per Dr. Zollie Pee last note he did mention that an MRI would be the next step.  Okay to order or does the pt need a phone/virtual visit for insurance purposes on getting MRI approved?

## 2020-03-27 NOTE — Telephone Encounter (Signed)
Called pt's mother and advised her regarding MRI arthrogram order.  Provided her w/ the phone number to [facility] to call and schedule.  She verbalizes understanding and does not have any other questions/concerns.

## 2020-03-27 NOTE — Telephone Encounter (Signed)
Patient failing to improve.  Plan for MRI arthrogram patient should have contact with med Center in [place] to schedule the MRI and the arthrogram component.

## 2020-04-09 ENCOUNTER — Ambulatory Visit (INDEPENDENT_AMBULATORY_CARE_PROVIDER_SITE_OTHER): Payer: 59

## 2020-04-09 ENCOUNTER — Ambulatory Visit (INDEPENDENT_AMBULATORY_CARE_PROVIDER_SITE_OTHER): Payer: 59 | Admitting: Sports Medicine

## 2020-04-09 ENCOUNTER — Other Ambulatory Visit: Payer: Self-pay

## 2020-04-09 DIAGNOSIS — G8929 Other chronic pain: Secondary | ICD-10-CM

## 2020-04-09 DIAGNOSIS — M25511 Pain in right shoulder: Secondary | ICD-10-CM

## 2020-04-09 NOTE — Progress Notes (Signed)
    Procedures performed today:    Procedure: Real-time Ultrasound Guided gadolinium contrast injection of right glenohumeral joint Device: Samsung HS60  Verbal informed consent obtained.  Time-out conducted.  Noted no overlying erythema, induration, or other signs of local infection.  Skin prepped in a sterile fashion.  Local anesthesia: Topical Ethyl chloride.  With sterile technique and under real time ultrasound guidance: Noted intact rotator cuff, 25-gauge needle advanced into the joint from a posterior approach, I injected 1 cc kenalog 40, 2 cc lidocaine, 2 cc bupivacaine, syringe switched and 0.1 cc gadolinium injected, syringe again switched and 10 cc sterile saline used to distend the joint. Joint visualized and capsule seen distending confirming intra-articular placement of contrast material and medication. Completed without difficulty  Advised to call if fevers/chills, erythema, induration, drainage, or persistent bleeding.  Images permanently stored in PACS Impression: Technically successful ultrasound guided gadolinium contrast injection for MR arthrography.  Please see separate MR arthrogram report.   Independent interpretation of notes and tests performed by another provider:   None.  Brief History, Exam, Impression, and Recommendations:    Chronic right shoulder pain Injection performed today for MR arthrography, further management per primary treating provider. Minor vagal episode during the procedure but everything went as planned.    ___________________________________________ Ihor Austin. Benjamin Stain, M.D., ABFM., CAQSM. Primary Care and Sports Medicine [facility]  Adjunct Instructor of Family Medicine  [employer]

## 2020-04-09 NOTE — Assessment & Plan Note (Addendum)
Injection performed today for MR arthrography, further management per primary treating provider. Minor vagal episode during the procedure but everything went as planned.

## 2020-04-10 ENCOUNTER — Telehealth: Payer: Self-pay | Admitting: Physical Therapy

## 2020-04-10 NOTE — Telephone Encounter (Signed)
Called pt and his mom's cell phone to relay shoulder MRI results that are also visible through MyChart.  Unable to LM on mom's cell phone due to VM being full.  Please transfer call back to clinical staff if pt or mother return call or if clinical staff unavailable, create phone note and will call back as soon as possible.  Pt is in college so may need to have follow-up visit be a virtual or phone visit unless he's home for spring break sometime soon.

## 2020-04-10 NOTE — Telephone Encounter (Signed)
Pt and his mom Cala Bradford called and spoke to them regarding his R shoulder MRI results.  Pt and mom ask questions as to what structures are involved and to what extent and I answered those to the best of my ability.  They ask if his case is surgical in nature and I inform them that due to the extent of the damage to the labrum that it appears that way but Dr. [name] will better be able to answer that question.  The pt is home from college until Friday morning at which time he will be returning to college.  I advise him that I'm sure Dr. [name] will be able to squeeze him into his schedule but that I will need to wait until Thursday morning to discuss this w/ Dr. [name] before scheduling him.  Inform them that we will call him back sometime Thursday morning to discuss a time for him to be seen for MRI follow-up prior to him leaving to return to college on Friday morning.

## 2020-04-12 NOTE — Progress Notes (Signed)
MRI arthrogram of the shoulder shows extensive tearing of the labrum and a rotator cuff tear.  This will likely require surgery.  I will attempt to call you today to go over these results in full detail.  Recommend either surgery here in [place] or back in school.

## 2020-04-12 NOTE — Telephone Encounter (Signed)
Patient called this morning and left a message asking about scheduling.

## 2020-04-12 NOTE — Telephone Encounter (Signed)
Dr. [name] spoke to pt on the phone this morning so no appt necessary.

## 2020-07-19 ENCOUNTER — Telehealth: Payer: Self-pay | Admitting: Family Medicine

## 2020-07-19 DIAGNOSIS — S43431A Superior glenoid labrum lesion of right shoulder, initial encounter: Secondary | ICD-10-CM

## 2020-07-19 DIAGNOSIS — M25511 Pain in right shoulder: Secondary | ICD-10-CM

## 2020-07-19 NOTE — Telephone Encounter (Signed)
Orthopedic surgery consultation ordered today.

## 2020-07-19 NOTE — Telephone Encounter (Signed)
Pt is known to need R shoulder surgery. Pt home for summer and mom would like [place] to refer to surgeon. They are not planning on doing the surgery until fall/winter break but would like to meet with the surgeon while pt is home this summer.

## 2020-07-20 ENCOUNTER — Ambulatory Visit: Payer: 59 | Admitting: Family Medicine

## 2020-07-24 NOTE — Telephone Encounter (Signed)
Spoke with pt mom, aware of referral and has been contacted by [employer].

## 2020-07-27 ENCOUNTER — Other Ambulatory Visit: Payer: Self-pay

## 2020-07-27 ENCOUNTER — Ambulatory Visit (INDEPENDENT_AMBULATORY_CARE_PROVIDER_SITE_OTHER): Payer: 59 | Admitting: Family Medicine

## 2020-07-27 ENCOUNTER — Encounter: Payer: Self-pay | Admitting: Family Medicine

## 2020-07-27 VITALS — BP 102/58 | HR 68 | Temp 97.8°F | Resp 16 | Ht 71.0 in | Wt 218.2 lb

## 2020-07-27 DIAGNOSIS — Z0000 Encounter for general adult medical examination without abnormal findings: Secondary | ICD-10-CM | POA: Diagnosis not present

## 2020-07-27 DIAGNOSIS — F902 Attention-deficit hyperactivity disorder, combined type: Secondary | ICD-10-CM

## 2020-07-27 DIAGNOSIS — F411 Generalized anxiety disorder: Secondary | ICD-10-CM

## 2020-07-27 DIAGNOSIS — Z23 Encounter for immunization: Secondary | ICD-10-CM | POA: Diagnosis not present

## 2020-07-27 MED ORDER — EVEKEO ODT 20 MG PO TBDP: 20.0000 mg | ORAL_TABLET | Freq: Two times a day (BID) | ORAL | 0 refills | Status: DC

## 2020-07-27 MED ORDER — GUANFACINE HCL ER 1 MG PO TB24: ORAL_TABLET | ORAL | 0 refills | Status: DC

## 2020-07-27 NOTE — Progress Notes (Signed)
Office Note 07/27/2020  CC:  Chief Complaint  Patient presents with   Annual Exam    HPI:  Andrew Christian is a 20 y.o. White male who is here for annual health maintenance exam and 6 mo f/u ADHD and GAD. A/P as of last visit: "ADHD, combined-type. GAD. He is well controlled on current regiment of evekeo ODT 20mg  bid, intuniv 3mg  qhs, and 1/2 of 100 mg sertraline qhs. Controlled substance contract reviewed with patient today.  Patient signed this and it will be placed in the chart.   I did electronic rx's for evekeo ODT 20mg , 1 bid, #60 today for each of the next 3 months.  Appropriate fill on/after date was noted on each rx"  INTERIM HX: Doing well.  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 Working out with wts and doing cardio regularly.  Had signif URI with ST a few weeks ago, lasted 2 wks and didn't eat or drink well.  No GI sx's. Still getting appetite and normal PO intake back.  Pt states all is going well with the med at current dosing: much improved focus, concentration, task completion.  Less frustration, better multitasking, less impulsivity and restlessness.  Mood is stable. No side effects from the medication. Taking evekeo almost daily, at least 1 per day. Guanfacine er 3mg  daily. Sertraline 50mg  qd. He is interested in weaning off guanfacine and sertraline, wants to minimize meds.  PMP AWARE reviewed today: most recent rx for evekeo odt 20mg  was filled 03/26/20, # 60, rx by me. No red flags.   Past Medical History:  Diagnosis Date   ADHD (attention deficit hyperactivity disorder)    managed by psych   Anxiety    managed by psych   Medial meniscus tear 07/2016   right knee   Obesity, Class I, BMI 30-34.9     Past Surgical History:  Procedure Laterality Date   KNEE ARTHROSCOPY Bilateral     Family History  Problem Relation Age of Onset   Thyroid disease Mother     High Cholesterol Father    ADD / ADHD Sister    Thyroid disease Maternal Grandmother    Heart disease Maternal Grandfather    Heart attack Maternal Grandfather    High Cholesterol Maternal Grandfather    Depression Paternal Grandmother     Social History   Socioeconomic History   Marital status: Single    Spouse name: Not on file   Number of children: Not on file   Years of education: Not on file   Highest education level: Not on file  Occupational History   Not on file  Tobacco Use   Smoking status: Never   Smokeless tobacco: Never  Vaping Use   Vaping Use: Never used  Substance and Sexual Activity   Alcohol use: No    Alcohol/week: 0.0 standard drinks   Drug use: No   Sexual activity: Never  Other Topics Concern   Not on file  Social History Narrative   [employer] grad.   [employer] student ([place], ), majoring in finance.   Fraternity.   Alc: social.   Tob: none   No drugs.   Social Determinants of Health   Financial Resource Strain: Not on file  Food Insecurity: Not on file  Transportation Needs: Not on file  Physical Activity: Not on file  Stress: Not on file  Social Connections: Not on file  Intimate  Partner Violence: Not on file    Outpatient Medications Prior to Visit  Medication Sig Dispense Refill   sertraline (ZOLOFT) 100 MG tablet TAKE 1/2 TO 1 TABLET(50 TO 100 MG) BY MOUTH EVERY MORNING 90 tablet 3   Amphetamine Sulfate (EVEKEO ODT) 20 MG TBDP Take 20 mg by mouth 2 (two) times daily. 60 tablet 0   GuanFACINE HCl 3 MG TB24 TAKE 1 TABLET(3 MG) BY MOUTH DAILY 90 tablet 3   No facility-administered medications prior to visit.    No Known Allergies  ROS Review of Systems  Constitutional:  Negative for appetite change, chills, fatigue and fever.  HENT:  Negative for congestion, dental problem, ear pain and sore throat.   Eyes:  Negative for discharge, redness and visual disturbance.  Respiratory:  Negative for cough, chest tightness,  shortness of breath and wheezing.   Cardiovascular:  Negative for chest pain, palpitations and leg swelling.  Gastrointestinal:  Negative for abdominal pain, blood in stool, diarrhea, nausea and vomiting.  Genitourinary:  Negative for difficulty urinating, dysuria, flank pain, frequency, hematuria and urgency.  Musculoskeletal:  Negative for arthralgias, back pain, joint swelling, myalgias and neck stiffness.  Skin:  Negative for pallor and rash.  Neurological:  Negative for dizziness, speech difficulty, weakness and headaches.  Hematological:  Negative for adenopathy. Does not bruise/bleed easily.  Psychiatric/Behavioral:  Negative for confusion and sleep disturbance. The patient is not nervous/anxious.    PE; Vitals with BMI 07/27/2020 01/30/2020 01/25/2020  Height 5\' 11"  5\' 11"  5\' 11"   Weight 218 lbs 3 oz 233 lbs 235 lbs 6 oz  BMI 30.45 32.51 32.85  Systolic 102 130  Diastolic 58 80 60  Pulse 68 70 66  Some encounter information is confidential and restricted. Go to Review Flowsheets activity to see all data.   Gen: Alert, well appearing.  Patient is oriented to person, place, time, and situation. AFFECT: pleasant, lucid thought and speech. ENT: Ears: EACs clear, normal epithelium.  TMs with good light reflex and landmarks bilaterally.  Eyes: no injection, icteris, swelling, or exudate.  EOMI, PERRLA. Nose: no drainage or turbinate edema/swelling.  No injection or focal lesion.  Mouth: lips without lesion/swelling.  Oral mucosa pink and moist.  Dentition intact and without obvious caries or gingival swelling.  Oropharynx without erythema, exudate, or swelling.  Neck: supple/nontender.  No LAD, mass, or TM.  Carotid pulses 2+ bilaterally, without bruits. CV: RRR, no m/r/g.   LUNGS: CTA bilat, nonlabored resps, good aeration in all lung fields. ABD: soft, NT, ND, BS normal.  No hepatospenomegaly or mass.  No bruits. EXT: no clubbing, cyanosis, or edema.  Musculoskeletal: no joint  swelling, erythema, warmth, or tenderness.  ROM of all joints intact. Skin - no sores or suspicious lesions or rashes or color changes  Pertinent labs:  Lab Results  Component Value Date   TSH 1.81 08/13/2018   Lab Results  Component Value Date   WBC 4.9 08/13/2018   HGB 15.8 08/13/2018   HCT 46.1 08/13/2018   MCV 87.7 08/13/2018   PLT 262.0 08/13/2018   Lab Results  Component Value Date   CREATININE 1.47 08/13/2018   BUN 20 08/13/2018   NA 140 08/13/2018   K 4.2 08/13/2018   CL 102 08/13/2018   CO2 30 08/13/2018   Lab Results  Component Value Date   ALT 16 08/13/2018   AST 25 08/13/2018   ALKPHOS 66 08/13/2018   BILITOT 0.5 08/13/2018   ASSESSMENT AND PLAN:  1) ADHD, combined type.  GAD. Doing well, wants to wean off of guanfacine and sertraline this summer. Plan: take guanfacine ER 1mg -->2 tabs qd x 7d, then 1 tab qd x 7d, then stop. Take 1/2 sertraline 100mg  tab EVERY OTHER day x 10 doses, then stop. I told him not to wean off these meds at the same time.  Wants to continue evekeo 20mg  odt qd-bid.  #60 eRx'd today.   CSC is UTD.  2) Health maintenance exam: Reviewed age and gender appropriate health maintenance issues (prudent diet, regular exercise, health risks of tobacco and excessive alcohol, use of seatbelts, fire alarms in home, use of sunscreen).  Also reviewed age and gender appropriate health screening as well as vaccine recommendations. Vaccines: Tdap booster due->given today.  Otherwise all UTD. Labs: none indicated.  An After Visit Summary was printed and given to the patient.  FOLLOW UP:  Return in about 6 months (around 01/26/2021) for f/u adhd.  Signed:  , MD           07/27/2020

## 2020-07-27 NOTE — Patient Instructions (Signed)
Take 1/2 tab sertraline every other day for 10 doses, then stop.

## 2020-07-30 ENCOUNTER — Ambulatory Visit: Payer: 59 | Admitting: Orthopaedic Surgery

## 2020-08-13 ENCOUNTER — Ambulatory Visit: Payer: 59 | Admitting: Orthopaedic Surgery

## 2020-08-20 ENCOUNTER — Ambulatory Visit: Payer: 59 | Admitting: Orthopaedic Surgery

## 2020-08-22 ENCOUNTER — Ambulatory Visit: Payer: 59 | Admitting: Orthopedic Surgery

## 2020-08-24 ENCOUNTER — Ambulatory Visit (INDEPENDENT_AMBULATORY_CARE_PROVIDER_SITE_OTHER): Payer: 59 | Admitting: Orthopedic Surgery

## 2020-08-24 ENCOUNTER — Encounter: Payer: Self-pay | Admitting: Orthopedic Surgery

## 2020-08-24 ENCOUNTER — Other Ambulatory Visit: Payer: Self-pay

## 2020-08-24 VITALS — Ht 72.0 in | Wt 225.0 lb

## 2020-08-24 DIAGNOSIS — G8929 Other chronic pain: Secondary | ICD-10-CM

## 2020-08-24 DIAGNOSIS — M25511 Pain in right shoulder: Secondary | ICD-10-CM

## 2020-08-24 NOTE — Progress Notes (Signed)
Office Visit Note   Patient: Andrew Christian           Date of Birth: 11-Oct-2000           MRN: [id number] Visit Date: 08/24/2020 Requested by: Jeoffrey Massed, MD [id number] [place],  [place] [address] PCP: Jeoffrey Massed, MD  Subjective: Chief Complaint  Patient presents with   Right Shoulder - Pain    HPI: Andrew Christian is a 20 y.o. male who presents to the office complaining of shoulder pain.  Patient complains of dull right shoulder pain without any acute injury event.  He has had symptoms for several months since fall 2021.  He had no specific injury but he did see his athletic trainer at the end of the year who thought it was a AC joint sprain.  He was subsequently seen by Dr. [name] and had MRI arthrogram of the right shoulder that revealed focal high-grade partial-thickness tear of the mid supraspinatus tendon with complex tearing of the glenoid labrum with involvement around 270 degrees.  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  Scan is reviewed with the patient and does show primarily degenerative labral pathology as opposed to acute labral stripping.  He also denies any mechanical symptoms in the shoulder.  He was able to compete in a scrimmage game in March of this year with no symptoms.  He notes soreness in the anterior lateral shoulder very occasionally.  He has no consistent pain and no pain that wakes him up at night.  No history of prior shoulder injury, dislocation, surgery.  No real medical history.  He has had prior knee arthroscopies for meniscus tears but that is about it.  He does not have to take any medications due to his symptoms and it does not limit him with any of the weight lifting that he does.  He is able to bench press and overhead press without discomfort and with no limitation.  Denies any neck pain or numbness/tingling since a stinger 1 to 2 years ago during season.  He is okay with contact drills and practice.  No real mechanical  symptoms.  He is currently a Therapist, art.  Season begins in the first week of September and ends the week before Thanksgiving.  He does have 1 year of eligibility following this year due to the COVID pandemic.  He does not play any other sports or do anything else that is superactive aside from weightlifting.  He states that if it was not for football activities, he would never really feel any discomfort or pain in his daily life..                ROS: All systems reviewed are negative as they relate to the chief complaint within the history of present illness.  Patient denies fevers or chills.  Assessment & Plan: Visit Diagnoses: No diagnosis found.  Plan: Patient is a 20 year old male who presents complaining of right shoulder pain.  He has very minimal right shoulder achiness and discomfort on occasion.  No symptoms that get in the way of him living his daily life or even in the way of his athletic activities.  He is a Insurance account manager.  He had MRI of the right shoulder that revealed complex tearing of the glenoid labrum as well as high-grade partial-thickness tear of the mid supraspinatus tendon.  He has no discernible weakness on exam and no real  pain on exam either.  He has no history of a specific injury and notes that it just feels like it is "wear and tear" of his shoulder.  With no real significant symptoms in his daily life or even with weightlifting and football, he should be okay to return to football activities for this next season as long as he wears a brace.  He has no history of shoulder dislocation or any feelings of instability.  If he does note increasing discomfort or dysfunction of the shoulder, recommended he reach out and halt his play in order to pursue surgical intervention.  Otherwise he may wait until after the season in November 2022 to have surgical intervention on the shoulder.  Card was given and he will call the office after the season.  If he  develops symptoms during the season then we could consider operative intervention which would be arthroscopy with treatment of the pathology as indicated.  This is a type of pattern that I frequently see in wrestlers with circumferential degenerative labral "tearing".  Operative indication for [name] would be development of symptoms.  I gave him [name]'s name and phone number to call after the completion of season so we can potentially schedule operative evaluation over Thanksgiving break for Christmas break if indicated.  I will talk to him at length over the phone prior to doing that.  He is at [employer].  Follow-Up Instructions: No follow-ups on file.   Orders:  No orders of the defined types were placed in this encounter.  No orders of the defined types were placed in this encounter.     Procedures: No procedures performed   Clinical Data: No additional findings.  Objective: Vital Signs: Ht 6' (1.829 m)   Wt 225 lb (102.1 kg)   BMI 30.52 kg/m   Physical Exam:  Constitutional: Patient appears well-developed HEENT:  Head: Normocephalic Eyes:EOM are normal Neck: Normal range of motion Cardiovascular: Normal rate Pulmonary/chest: Effort normal Neurologic: Patient is alert Skin: Skin is warm Psychiatric: Patient has normal mood and affect  Ortho Exam: Ortho exam demonstrates right shoulder with 50 degrees external rotation, 100 degrees abduction, 180 degrees forward flexion.  Excellent strength of supra, infra, subscap with no crepitus noted with passive motion of the shoulder.  5/5 motor strength of bilateral grip strength, finger abduction, pronation/supination, bicep, tricep, deltoid.  No pain with cervical spine range of motion.  No tenderness over the [facility] joint.  No tenderness over the bicipital groove.  Negative O'Brien sign.  Negative apprehension sign.  Negative drop arm test, Hornblower sign, belly press test.  Specialty Comments:  No specialty comments  available.  Imaging: No results found.   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   Chronic seasonal allergic rhinitis 04/25/2016   GAD (generalized anxiety disorder) 01/14/2015   Attention deficit hyperactivity disorder (ADHD), predominantly inattentive type 01/14/2015   Past Medical History:  Diagnosis Date   ADHD (attention deficit hyperactivity disorder)    managed by psych   Anxiety    managed by psych   Medial meniscus tear 07/2016   right knee   Obesity, Class I, BMI 30-34.9     Family History  Problem Relation Age of Onset   Thyroid disease Mother    High Cholesterol Father    ADD / ADHD Sister    Thyroid disease Maternal Grandmother    Heart disease Maternal Grandfather  Heart attack Maternal Grandfather    High Cholesterol Maternal Grandfather    Depression Paternal Grandmother     Past Surgical History:  Procedure Laterality Date   KNEE ARTHROSCOPY Bilateral    Social History   Occupational History   Not on file  Tobacco Use   Smoking status: Never   Smokeless tobacco: Never  Vaping Use   Vaping Use: Never used  Substance and Sexual Activity   Alcohol use: No    Alcohol/week: 0.0 standard drinks   Drug use: No   Sexual activity: Never

## 2020-08-25 ENCOUNTER — Encounter: Payer: Self-pay | Admitting: Orthopedic Surgery

## 2020-09-10 ENCOUNTER — Telehealth: Payer: Self-pay

## 2020-09-10 NOTE — Telephone Encounter (Signed)
Father of patient, Dmani Mizer [facility]) dropped off form that needs to be completed then signed by Dr. Milinda Cave.  Annual Medical Follow-Up Documentation for Student Athletes Taking ADHD / ADD Medication with [employer] Banned Stimulant Medication  Form placed in provider inbox at front office  Please call [phone] when ready for pick up.

## 2020-09-10 NOTE — Telephone Encounter (Signed)
Placed on PCP desk to review and sign, if appropriate.  

## 2020-09-10 NOTE — Telephone Encounter (Signed)
Spoke with pt's dad(DPR) to inform of completion. Form has been placed up front for pick up.

## 2020-09-10 NOTE — Telephone Encounter (Signed)
Signed and placed on Britt's desk. Signed:  Santiago Bumpers, MD           09/10/2020

## 2020-09-17 ENCOUNTER — Other Ambulatory Visit: Payer: Self-pay

## 2020-09-17 MED ORDER — EVEKEO ODT 20 MG PO TBDP: 20.0000 mg | ORAL_TABLET | Freq: Two times a day (BID) | ORAL | 0 refills | Status: DC

## 2020-09-17 NOTE — Telephone Encounter (Signed)
LM for pt to return call regarding refill 

## 2020-09-17 NOTE — Telephone Encounter (Signed)
RequestingStann Mainland Contract: 01/25/20 UDS: N/A Last Visit: 07/27/20 Next Visit: advised to f/u December Last Refill: 07/27/20(60,0)  Please review and advise Med pending

## 2020-09-17 NOTE — Telephone Encounter (Signed)
Patient refill request  Amphetamine Sulfate (EVEKEO ODT) 20 MG TBDP [[id number]]   [facility] [place]

## 2020-09-18 NOTE — Telephone Encounter (Signed)
Medication is on backorder so pharmacy is trying to order it. Confirmed pt has not picked up the medication yet.

## 2020-10-05 ENCOUNTER — Telehealth: Payer: Self-pay | Admitting: Family Medicine

## 2020-10-05 MED ORDER — EVEKEO ODT 20 MG PO TBDP: 20.0000 mg | ORAL_TABLET | Freq: Two times a day (BID) | ORAL | 0 refills | Status: DC

## 2020-10-05 NOTE — Telephone Encounter (Signed)
Patient's mother requesting Marga Melnick changed to [employer], [place], phone. [phone]. She states the current [facility] is unable to get medication but the [facility] location has it in stock.

## 2020-10-05 NOTE — Telephone Encounter (Signed)
RequestingStann Mainland Contract: 01/25/20 UDS: N/A Last Visit:07/27/20 Next Visit:advised to f/u 6 mo. Last Refill: 09/17/20(60,0)  Please review and advise, med pending

## 2020-10-05 NOTE — Telephone Encounter (Signed)
Rx sent 

## 2020-10-05 NOTE — Telephone Encounter (Signed)
Pt's mother, Kim(DPR) was advised regarding pharmacy change for medication.

## 2020-10-22 ENCOUNTER — Other Ambulatory Visit: Payer: Self-pay | Admitting: Family Medicine

## 2020-10-22 ENCOUNTER — Telehealth: Payer: Self-pay

## 2020-10-22 MED ORDER — AMPHETAMINE SULFATE 10 MG PO TABS: ORAL_TABLET | ORAL | 0 refills | Status: DC

## 2020-10-22 NOTE — Telephone Encounter (Signed)
Ok, rx sent.  The only problem he may run into is insurance denial.

## 2020-10-22 NOTE — Telephone Encounter (Signed)
Pt's mom called requesting Amphetamine Sulfate (EVEKEO ODT) 20 MG TBDP that be changed to the 10 mg and doubling the dispense because she is having difficulty finding the 20 mg at any pharmacy. She would like to have it sent to the [facility] in [place].

## 2020-10-23 ENCOUNTER — Telehealth: Payer: Self-pay

## 2020-10-23 NOTE — Telephone Encounter (Addendum)
PA sent via covermymed on 10/23/20   Key: [id number]   Medication: EVEKEO 10MG  TABLET   Dx: , ADHD (attention deficit hyperactivity disorder), combined type   Per Dr. O03.2 pt has tried and failed methlyphenidate, atomoxetine   Waiting for response.

## 2020-10-23 NOTE — Telephone Encounter (Signed)
Pt's mother advised of rx update, she voiced understanding.

## 2020-10-24 NOTE — Telephone Encounter (Signed)
Patients mother states insurance will only cover dissolvable tablets and it needs to end in ODT*

## 2020-10-24 NOTE — Telephone Encounter (Signed)
Please advise 

## 2020-10-25 ENCOUNTER — Other Ambulatory Visit: Payer: Self-pay | Admitting: Family Medicine

## 2020-10-25 MED ORDER — EVEKEO ODT 10 MG PO TBDP: ORAL_TABLET | ORAL | 0 refills | Status: DC

## 2020-10-25 NOTE — Telephone Encounter (Signed)
Spoke with pt's mother to advise new rx sent in.

## 2020-10-25 NOTE — Telephone Encounter (Signed)
Ok odt rx sent

## 2020-12-07 ENCOUNTER — Telehealth: Payer: Self-pay | Admitting: Orthopedic Surgery

## 2020-12-07 NOTE — Telephone Encounter (Signed)
Pt called stating Dr. August Saucer told him to call Leotis Shames F when ready for surgery. Pt is asking for a call back from [name] F. Please call pt at [phone]

## 2020-12-10 NOTE — Telephone Encounter (Signed)
Done thx

## 2020-12-14 ENCOUNTER — Other Ambulatory Visit: Payer: Self-pay

## 2020-12-14 MED ORDER — EVEKEO ODT 10 MG PO TBDP: ORAL_TABLET | ORAL | 0 refills | Status: DC

## 2020-12-14 NOTE — Telephone Encounter (Signed)
Patient refill request.  [facility] [place]  Amphetamine Sulfate (EVEKEO ODT) 10 MG TBDP [[id number]]

## 2020-12-14 NOTE — Telephone Encounter (Signed)
RequestingStann Mainland Contract: 01/25/20 UDS: N/A Last Visit: 07/27/20 Next Visit: advised to f/u December Last Refill: 10/25/20(60,0)   Please review and advise Med pending

## 2021-01-03 HISTORY — PX: SHOULDER ARTHROSCOPY: SHX128

## 2021-01-08 ENCOUNTER — Other Ambulatory Visit: Payer: Self-pay

## 2021-01-08 MED ORDER — SERTRALINE HCL 100 MG PO TABS: ORAL_TABLET | ORAL | 0 refills | Status: DC

## 2021-01-11 ENCOUNTER — Ambulatory Visit: Admit: 2021-01-11 | Payer: 59 | Admitting: Orthopedic Surgery

## 2021-01-11 SURGERY — ARTHROSCOPY, SHOULDER, WITH GLENOID LABRUM REPAIR
Anesthesia: General | Site: Shoulder | Laterality: Right

## 2021-01-21 ENCOUNTER — Other Ambulatory Visit: Payer: Self-pay | Admitting: Surgical

## 2021-01-21 ENCOUNTER — Other Ambulatory Visit: Payer: Self-pay

## 2021-01-21 ENCOUNTER — Encounter: Payer: Self-pay | Admitting: Orthopedic Surgery

## 2021-01-21 DIAGNOSIS — S43491D Other sprain of right shoulder joint, subsequent encounter: Secondary | ICD-10-CM

## 2021-01-21 MED ORDER — OXYCODONE-ACETAMINOPHEN 5-325 MG PO TABS: 1.0000 | ORAL_TABLET | ORAL | 0 refills | Status: DC | PRN

## 2021-01-21 MED ORDER — METHOCARBAMOL 500 MG PO TABS: 500.0000 mg | ORAL_TABLET | Freq: Three times a day (TID) | ORAL | 0 refills | Status: DC | PRN

## 2021-01-21 MED ORDER — EVEKEO ODT 10 MG PO TBDP: ORAL_TABLET | ORAL | 0 refills | Status: DC

## 2021-01-21 MED ORDER — CELECOXIB 100 MG PO CAPS: 100.0000 mg | ORAL_CAPSULE | Freq: Two times a day (BID) | ORAL | 0 refills | Status: DC

## 2021-01-21 NOTE — Telephone Encounter (Signed)
Requesting: evekeo Contract:01/25/20 UDS:n/a Last Visit:07/27/20 Next Visit:n/a Last Refill:12/14/20 (60,0)  Please Advise

## 2021-01-21 NOTE — Telephone Encounter (Signed)
Rx sent. °Pt due for f/u. °

## 2021-01-22 NOTE — Telephone Encounter (Signed)
LVM for pt to call back.

## 2021-01-23 NOTE — Telephone Encounter (Signed)
Pt advised refill sent. He will call back to schedule f/u due to school schedule

## 2021-01-30 ENCOUNTER — Ambulatory Visit (INDEPENDENT_AMBULATORY_CARE_PROVIDER_SITE_OTHER): Payer: 59 | Admitting: Orthopaedic Surgery

## 2021-01-30 ENCOUNTER — Encounter: Payer: Self-pay | Admitting: Orthopaedic Surgery

## 2021-01-30 ENCOUNTER — Other Ambulatory Visit: Payer: Self-pay

## 2021-01-30 DIAGNOSIS — S43431A Superior glenoid labrum lesion of right shoulder, initial encounter: Secondary | ICD-10-CM

## 2021-01-30 NOTE — Progress Notes (Signed)
° °  Post-Op Visit Note   Patient: Andrew Christian           Date of Birth: 2000-04-29           MRN: [id number] Visit Date: 01/30/2021 PCP: Jeoffrey Massed, MD   Assessment & Plan:  Chief Complaint:  Chief Complaint  Patient presents with   Right Shoulder - Pain   Visit Diagnoses:  1. Glenoid labral tear, right, initial encounter     Plan: Andrew Christian comes back today 1 week status post right shoulder arthroscopy labral repair by Dr. August Saucer.  His mother is here with him today.  Overall he is doing well and describes some mild pain.  Right shoulder surgical incisions are healed.  No signs of infection.  Sutures removed today.  Passive forward flexion to 90 abduction to 75 external rotation to 45 without significant discomfort.  Neurovascular intact distally.  Arthroscopic photos reviewed with the patient today.  He should continue to wear the sling at all times except when doing PT or working on elbow range of motion.  I made a referral to outpatient PT at [facility].  He will follow-up with Dr. August Saucer as scheduled on 02/13/2021.  Follow-Up Instructions: Return for as scheduled with August Saucer on 02/13/21.   Orders:  No orders of the defined types were placed in this encounter.  No orders of the defined types were placed in this encounter.   Imaging: No results found.  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   Chronic seasonal allergic rhinitis 04/25/2016   GAD (generalized anxiety disorder) 01/14/2015   Attention deficit hyperactivity disorder (ADHD), predominantly inattentive type 01/14/2015   Past Medical History:  Diagnosis Date   ADHD (attention deficit hyperactivity disorder)    managed by psych   Anxiety    managed by psych   Medial meniscus tear 07/2016   right knee   Obesity, Class I, BMI 30-34.9     Family History  Problem Relation Age of Onset   Thyroid disease Mother     High Cholesterol Father    ADD / ADHD Sister    Thyroid disease Maternal Grandmother    Heart disease Maternal Grandfather    Heart attack Maternal Grandfather    High Cholesterol Maternal Grandfather    Depression Paternal Grandmother     Past Surgical History:  Procedure Laterality Date   KNEE ARTHROSCOPY Bilateral    Social History   Occupational History   Not on file  Tobacco Use   Smoking status: Never   Smokeless tobacco: Never  Vaping Use   Vaping Use: Never used  Substance and Sexual Activity   Alcohol use: No    Alcohol/week: 0.0 standard drinks   Drug use: No   Sexual activity: Never

## 2021-01-31 ENCOUNTER — Telehealth: Payer: Self-pay

## 2021-01-31 NOTE — Telephone Encounter (Signed)
Per Dr August Saucer I called patient to advise him to remain in sling for first 3 weeks post op except for showers and to old of on ROM exercises until he sees Dr August Saucer in follow up

## 2021-02-07 ENCOUNTER — Other Ambulatory Visit: Payer: Self-pay | Admitting: Family Medicine

## 2021-02-07 NOTE — Telephone Encounter (Signed)
Has upcoming appt °

## 2021-02-12 ENCOUNTER — Other Ambulatory Visit: Payer: Self-pay | Admitting: Pediatrics

## 2021-02-13 ENCOUNTER — Ambulatory Visit (INDEPENDENT_AMBULATORY_CARE_PROVIDER_SITE_OTHER): Payer: 59 | Admitting: Orthopedic Surgery

## 2021-02-13 ENCOUNTER — Encounter: Payer: Self-pay | Admitting: Family Medicine

## 2021-02-13 ENCOUNTER — Ambulatory Visit (INDEPENDENT_AMBULATORY_CARE_PROVIDER_SITE_OTHER): Payer: 59 | Admitting: Family Medicine

## 2021-02-13 ENCOUNTER — Other Ambulatory Visit: Payer: Self-pay

## 2021-02-13 VITALS — BP 115/76 | HR 63 | Temp 98.1°F | Ht 72.0 in | Wt 231.4 lb

## 2021-02-13 DIAGNOSIS — S43431A Superior glenoid labrum lesion of right shoulder, initial encounter: Secondary | ICD-10-CM

## 2021-02-13 DIAGNOSIS — F902 Attention-deficit hyperactivity disorder, combined type: Secondary | ICD-10-CM

## 2021-02-13 DIAGNOSIS — Z23 Encounter for immunization: Secondary | ICD-10-CM | POA: Diagnosis not present

## 2021-02-13 DIAGNOSIS — F411 Generalized anxiety disorder: Secondary | ICD-10-CM

## 2021-02-13 MED ORDER — AMPHETAMINE SULFATE 10 MG PO TABS
ORAL_TABLET | ORAL | 0 refills | Status: DC
Start: 2021-02-13 — End: 2021-02-13

## 2021-02-13 MED ORDER — AMPHETAMINE SULFATE 10 MG PO TABS
ORAL_TABLET | ORAL | 0 refills | Status: DC
Start: 2021-02-13 — End: 2021-04-08

## 2021-02-13 MED ORDER — AMPHETAMINE SULFATE 10 MG PO TABS: ORAL_TABLET | ORAL | 0 refills | Status: DC

## 2021-02-13 MED ORDER — SERTRALINE HCL 100 MG PO TABS: ORAL_TABLET | ORAL | 3 refills | Status: DC

## 2021-02-13 NOTE — Progress Notes (Addendum)
OFFICE VISIT  02/13/2021  CC: f/u ADD and GAD  Patient is a 21 y.o. male who presents for 6 mo f/u ADHD and GAD. A/P as of last visit: "1) ADHD, combined type.  GAD. Doing well, wants to wean off of guanfacine and sertraline this summer. Plan: take guanfacine ER 1mg -->2 tabs qd x 7d, then 1 tab qd x 7d, then stop. Take 1/2 sertraline 100mg  tab EVERY OTHER day x 10 doses, then stop. I told him not to wean off these meds at the same time.   Wants to continue evekeo 20mg  odt qd-bid.  #60 eRx'd today.   CSC is UTD.   2) Health maintenance exam: Reviewed age and gender appropriate health maintenance issues (prudent diet, regular exercise, health risks of tobacco and excessive alcohol, use of seatbelts, fire alarms in home, use of sunscreen).  Also reviewed age and gender appropriate health screening as well as vaccine recommendations. Vaccines: Tdap booster due->given today.  Otherwise all UTD. Labs: none indicated."  INTERIM HX: Andrew Christian is doing well.  He got right shoulder arthroscopy with repair of complex labral tear about 2 weeks ago.  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   Says he is recovering well.  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 Has stayed on the Zoloft at the 100 mg dosing.  He was able to get off the guanfacine without problem.  Mood and anxiety levels are good.  He sleeps well and is eating fine.  ADD: Pt states all is going well with the med at current dosing: much improved focus, concentration, task completion.  Less frustration, better multitasking, less impulsivity and restlessness.  Mood is stable. No side effects from the medication.  PMP AWARE reviewed today: most recent rx for Evekeo was filled 12/17/20, # 79, rx by me. No red flags.  Past Medical History:  Diagnosis Date   ADHD (attention deficit hyperactivity disorder)    managed by psych   Anxiety    managed by psych   Medial meniscus tear 07/2016    right knee   Obesity, Class I, BMI 30-34.9     Past Surgical History:  Procedure Laterality Date   KNEE ARTHROSCOPY Bilateral    SHOULDER ARTHROSCOPY Right 01/2021   Right glenoid labral repair    Outpatient Medications Prior to Visit  Medication Sig Dispense Refill   Amphetamine Sulfate (EVEKEO ODT) 10 MG TBDP 2 tabs po qd 60 tablet 0   sertraline (ZOLOFT) 100 MG tablet TAKE 1/2 TO 1 TABLET(50 TO 100 MG) BY MOUTH EVERY MORNING. OFFICE VISIT NEEDED FOR FURTHER REFILLS 30 tablet 0   celecoxib (CELEBREX) 100 MG capsule Take 1 capsule (100 mg total) by mouth 2 (two) times daily. (Patient not taking: Reported on 02/13/2021) 60 capsule 0   guanFACINE (INTUNIV) 1 MG TB24 ER tablet 2 tabs po qd x 7d, then 1 tab po qd x 7d, then stop (Patient not taking: Reported on 02/13/2021) 21 tablet 0   methocarbamol (ROBAXIN) 500 MG tablet Take 1 tablet (500 mg total) by mouth every 8 (eight) hours as needed. (Patient not taking: Reported on 02/13/2021) 30 tablet 0   oxyCODONE-acetaminophen (PERCOCET) 5-325 MG tablet Take 1 tablet by mouth every 4 (four) hours as needed for severe pain. (Patient not taking: Reported on 02/13/2021) 30 tablet 0   No facility-administered medications prior to visit.    No Known Allergies  ROS As per HPI  PE: Vitals with BMI  02/13/2021 08/24/2020 07/27/2020  Height 6\' 0"  6\' 0"  5\' 11"   Weight 231 lbs 6 oz 225 lbs 218 lbs 3 oz  BMI 31.38 [id number]  Systolic [id number] - [id number]  Diastolic 76 - 58  Pulse 63 - 68  Some encounter information is confidential and restricted. Go to Review Flowsheets activity to see all data.    Physical Exam  Wt Readings from Last 2 Encounters:  02/13/21 231 lb 6.4 oz (105 kg)  08/24/20 225 lb (102.1 kg)    Gen: alert, oriented x 4, affect pleasant.  Lucid thinking and conversation noted. HEENT: PERRLA, EOMI.   Neck: no LAD, mass, or thyromegaly. CV: RRR, no m/r/g LUNGS: CTA bilat, nonlabored. NEURO: no tremor or tics noted on observation.   Coordination intact. CN 2-12 grossly intact bilaterally, strength 5/5 in all extremeties.  No ataxia.  LABS:  none  IMPRESSION AND PLAN:  #1 ADHD, combined type. Stable, continue evekeo 10mg , 2 tabs qAM.  Had to switch to the regular tab rather than the oral disintegrating form due to ODT not being available at pharmacies lately. I did electronic rx's for this med today for each of the next 3 mo.  Appropriate fill on/after date was noted on each rx. Controlled substance contract up-to-date.  #2 GAD.  Doing well, stable on sertraline 100 mg daily at this time. He will consider weaning off of this medication once all his classes are finished this summer.  An After Visit Summary was printed and given to the patient.  FOLLOW UP: Return in about 6 months (around 08/13/2021) for f/u ADHD.  Signed:  Crissie Sickles, MD           02/13/2021

## 2021-02-13 NOTE — Addendum Note (Signed)
Addended by: Emi Holes D on: 02/13/2021 01:12 PM   Modules accepted: Orders

## 2021-02-15 ENCOUNTER — Telehealth: Payer: Self-pay

## 2021-02-15 NOTE — Telephone Encounter (Signed)
PA sent via covermymed on 02/15/21   Key:  Key: [id number] - PA Case ID: [id number]   Medication: Amphetamine 10mg  tablet   Dx: ADHD F90.2   Per Dr. pt has tried and failed N/A   Waiting for response.

## 2021-02-15 NOTE — Telephone Encounter (Signed)
Updated PA completed, key # [id number]. Pt's mother advised regarding PA completion. Will wait for determination

## 2021-02-15 NOTE — Telephone Encounter (Signed)
Updated PA completed, key # [id number] Amphetamine Sulfate(Evekeo) 10mg  tablet

## 2021-02-15 NOTE — Telephone Encounter (Signed)
Pt's mother called stating that the Rx that was sent to pharmacy is not correct. She went to the pharmacy and the [name] is not covered as ODT. Notified her that it was sent in as a tablet. She states that for some reason it is also coming up as adderall when she looked at the pharmacy record. She is requesting that a PA be done for [name].

## 2021-02-17 ENCOUNTER — Encounter: Payer: Self-pay | Admitting: Orthopedic Surgery

## 2021-02-17 NOTE — Progress Notes (Signed)
° °  Post-Op Visit Note   Patient: Andrew Christian           Date of Birth: 06-13-2000           MRN: [id number] Visit Date: 02/13/2021 PCP: Andrew Massed, MD   Assessment & Plan:  Chief Complaint:  Chief Complaint  Patient presents with   Right Shoulder - Routine Post Op   Visit Diagnoses: No diagnosis found.  Plan: Andrew Christian is a patient who underwent right shoulder arthroscopy and labral repair 01/21/2021.  He has been in the sling.  Progressing well.  No fevers or chills.  On exam he has passive range of motion of 40/85/150.  He plays football for [employer]  Shoulder does feel stable with anterior shuck testing.  Plan is sling immobilization full-time for the first 3 weeks.  He can be part-time from weeks 4-6 but I do want him in general to stay in the sling during that initial 6-week time.  Note is provided for his athletic trainer along with a copy of the op note.  In general he is okay to do rotator cuff strengthening exercises below shoulder level 6 weeks postop and begin overhead motion 6 weeks postop.  Do not anticipate return to contact activity before 4 months postop.  He will follow-up with [place] during spring break potentially closer to the end of the semester.  Follow-Up Instructions: No follow-ups on file.   Orders:  No orders of the defined types were placed in this encounter.  No orders of the defined types were placed in this encounter.   Imaging: No results found.  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   Chronic seasonal allergic rhinitis 04/25/2016   GAD (generalized anxiety disorder) 01/14/2015   Attention deficit hyperactivity disorder (ADHD), predominantly inattentive type 01/14/2015   Past Medical History:  Diagnosis Date   ADHD (attention deficit hyperactivity disorder)    managed by psych   Anxiety    managed by psych   Medial meniscus  tear 07/2016   right knee   Obesity, Class I, BMI 30-34.9     Family History  Problem Relation Age of Onset   Thyroid disease Mother    High Cholesterol Father    ADD / ADHD Sister    Thyroid disease Maternal Grandmother    Heart disease Maternal Grandfather    Heart attack Maternal Grandfather    High Cholesterol Maternal Grandfather    Depression Paternal Grandmother     Past Surgical History:  Procedure Laterality Date   KNEE ARTHROSCOPY Bilateral    SHOULDER ARTHROSCOPY Right 01/2021   Right glenoid labral repair   Social History   Occupational History   Not on file  Tobacco Use   Smoking status: Never   Smokeless tobacco: Never  Vaping Use   Vaping Use: Never used  Substance and Sexual Activity   Alcohol use: No    Alcohol/week: 0.0 standard drinks   Drug use: No   Sexual activity: Never

## 2021-02-28 NOTE — Telephone Encounter (Signed)
New PA request submitted with [facility] pharmacy help desk [phone]. Ref # [id number]. Dx. F90.0 (ADHD). Pt has tried Eveko ODT 20mg  09/27/18-10/22/20, Strattera 12/16/10-07/24/16, Guanfacine 3mg  TB24 07/22/16-07/27/20 and 1mg (INTUNIV) 07/27/20-02/13/21.   Covered medications are Amphetamine (Adderall) 7.5mg , 10mg , 15mg , 20mg  and 30mg .

## 2021-04-08 ENCOUNTER — Other Ambulatory Visit: Payer: Self-pay

## 2021-04-08 MED ORDER — AMPHETAMINE SULFATE 10 MG PO TABS: ORAL_TABLET | ORAL | 0 refills | Status: DC

## 2021-04-08 NOTE — Telephone Encounter (Signed)
Requesting: Evekeo ?Contract: 01/25/20 ?UDS: N/A ?Last Visit: 02/13/21 ?Next Visit: advised to f/u 6 mo. ?Last Refill: 02/13/21(60,0) ? ?Please Advise. Med pending ? ?

## 2021-04-08 NOTE — Telephone Encounter (Signed)
Patient refill request.  Patient will be in from college this weekend, prescription does not need to be filled until then. ? ?[facility] [place] ? ?Amphetamine Sulfate (EVEKEO) 10 MG TABS [[id number]]  ?

## 2021-04-09 ENCOUNTER — Telehealth: Payer: Self-pay | Admitting: Family Medicine

## 2021-04-09 NOTE — Telephone Encounter (Signed)
Last rx completed was 04/08/21 for Amphetamine Sulfate(Evekeo) 10mg  for generic and not DAW for brand. Pt's last OV was 02/13/21 and advised to follow up in 6 months.  ? ?PA completed 02/15/21 denied due to not meeting the following clinical requirements: ? ?The requested medication and/or diagnosis are not a covered benefit and excluded from coverage in ?accordance with the terms and conditions of your plan benefit. Therefore, the request has been ?administratively denied. ?The requested medication and/or diagnosis are not a covered benefit and are excluded from coverage ?in accordance with the terms and conditions of your plan benefit. Therefore, this request has been ?administratively denied. ? ? ?New PA completed on 02/28/21.  Pt has tried Eveko ODT 20mg  09/27/18-10/22/20, Strattera 12/16/10-07/24/16, Guanfacine 3mg  TB24 07/22/16-07/27/20 and 1mg (INTUNIV) 07/27/20-02/13/21. Covered medications are Amphetamine (Adderall) 7.5mg , 10mg , 15mg , 20mg  and 30mg .  ? ? ?Please review and advise.  ?

## 2021-04-09 NOTE — Telephone Encounter (Signed)
Concern was escalated to Andrew Christian, Asst Director. She called me and states she spoke to pts mother.  ? ?At this time she is stating the RX for Amphetamine Sulfate (EVEKEO) 10 MG TABS  needs to be sent for "brand name only" and that this issue comes up every month.  ? ?Please resend for brand name Evekeo or insurance will not cover it.  ? ? ?

## 2021-04-09 NOTE — Telephone Encounter (Signed)
Andrew Christian spoke to me about pts mother and asked me to take call. When she went back to desk to transfer the mother had disconnected. I called the cell phone on file. Andrew Christian confirmed she called about her son, said she has 2 cell phone #s but she didn't know what kind of games we were playing. She said she was just told Andrew Christian was the office manager & didn't have the desire to speak with me or anyone else. She wanted return call from [place]. She did not let me speak at all & then hung up.  ?

## 2021-04-09 NOTE — Telephone Encounter (Signed)
Pt's mom(Kim) called [facility] and asked to speak with Harriett Sine. I asked who was calling and to get some information on what was going on. She said her son is having a very hard time getting a prescription. She doesn't understand why, because her father gets this script and there has never been an issue. I offered to put her on hold, so I could maybe help the situation. She did not want this, saying "I don't want to deal with that place any longer". I let her know I would give Harriett Sine the message.  ? ?Kim's [phone] ?

## 2021-04-09 NOTE — Telephone Encounter (Signed)
Pt's mom 

## 2021-04-09 NOTE — Telephone Encounter (Signed)
Pt mother called, son needing refill for med ?Mother wants to know can the medication automatically be filled every month. ?She is confused as to why she has to call in for med refill. ? ?Amphetamine Sulfate ?Amphetamine Sulfate (EVEKEO) 10 MG TABS ? ? ?[id number] - [place], [place] - [address] [place] [address] [place] [address] Phone:  [phone]  ?Fax:  [phone]  ?  ? ? ? ?Anything prescription wise, pt mother said please call her cell if it's dealing with her son medication. ?Pt mother cell: [phone] ?

## 2021-04-09 NOTE — Telephone Encounter (Signed)
Spoke with pt's mother pertaining to medication. She was informed that medication could not be filled more than 30 days at a time. She did not understand why when her daughter sees the other provider and does not have any issues with medication refills. I informed it was a controlled medication and could not be automatically refilled every month like other medications. I work with Dr.McGowen and as far as I know medications can only be filled 30 days at a tim from any provider. I tried answering her questions but she interrupted stating she was the customer and requesting to speak with an office manager and get their name and phone number. She agreed to be placed on a brief hold and assistance received. ?

## 2021-04-09 NOTE — Telephone Encounter (Signed)
LM for pt regarding medication ?

## 2021-04-09 NOTE — Telephone Encounter (Signed)
Per Benjamine Mola, pt mother also requesting 3 months to be sent in (as Dr. Raoul Pitch does for her daughter).  ?

## 2021-04-10 MED ORDER — EVEKEO ODT 10 MG PO TBDP: ORAL_TABLET | ORAL | 0 refills | Status: DC

## 2021-04-10 NOTE — Telephone Encounter (Signed)
Please refer to phone note from 3/7 ?

## 2021-04-10 NOTE — Telephone Encounter (Signed)
Evekio 10 odt 90d supply sent:  BRAND NAME only specified on rx. ?

## 2021-04-10 NOTE — Telephone Encounter (Signed)
Andrew Christian (Key: [id number]) - [phone] ?Evekeo ODT 10MG  dispersible tablets ?    ?Status: PA Request ? ?Created: March 8th, 2023 ? ?Sent: March 8th, 2023 ? ? ?Spoke with pharmacy staff and brand name require PA. Pt's insurance will only cover 31 day supply at a time and possibly 60 day supply max. Medication is $425 with insurance. Medication is not currently in stock and would have to be ordered. Insurance does cover generic adderall. LM for pt's mother to return call. ?

## 2021-04-10 NOTE — Telephone Encounter (Signed)
Denied today ?Request Reference Number: [id number]. EVEKEO ODT TAB 10MG  is denied for not meeting the prior authorization requirement(s). ? ?** The requested medication and/or diagnosis are not a covered benefit and excluded from coverage in ?accordance with the terms and conditions of your plan benefit. Therefore, the request has been ?administratively denied. ?** The requested medication and/or diagnosis are not a covered benefit and are excluded from coverage ?in accordance with the terms and conditions of your plan benefit. Therefore, this request has been ?administratively denied. ?

## 2021-04-11 NOTE — Telephone Encounter (Signed)
LM for pt's mother to return call regarding medication update. ? ?

## 2021-04-12 NOTE — Telephone Encounter (Signed)
LM for pt's dad regarding medication. ? ?If callback received, please see information below ?

## 2021-04-15 MED ORDER — AMPHETAMINE SULFATE 10 MG PO TABS: ORAL_TABLET | ORAL | 0 refills | Status: DC

## 2021-04-15 MED ORDER — AMPHETAMINE SULFATE 10 MG PO TABS
ORAL_TABLET | ORAL | 0 refills | Status: DC
Start: 2021-04-15 — End: 2021-04-15

## 2021-04-15 NOTE — Telephone Encounter (Signed)
Pt mother calling about medication. ?Mother said she's trying to get medication for son for 2 weeks. She said it's not being put in correctly. Pt is almost out of medication ? ?Mother is saying that he's needing "EVEKEO No odt," needing brand name. ?Pt mother said it won't go through with odt  ? ?Pt mother cell: [phone] ? ? ?

## 2021-04-15 NOTE — Telephone Encounter (Addendum)
Pt's mother advised Evekeo ODT and Evekeo 10mg  brand name are not covered by insurance. Both PA denied and alternative options given. She declined stating pt would not be able to tolerate Adderall of any dose. She would like Eveko 10mg  without ODT called in for 30 d/s for the next 3 months since insurance does not cover 90 d/s. She will deal with insurance regarding coverage (grandfather rule) ? ? ?Please review and advise ? ?

## 2021-04-15 NOTE — Telephone Encounter (Signed)
Andrew Christian 10mg , 2 qd, #60 sent in for each of the next 3 mo ?

## 2021-04-16 NOTE — Telephone Encounter (Signed)
LM for pt's mother regarding medication 

## 2021-04-17 NOTE — Telephone Encounter (Signed)
Andrew Christian (Mother of pt) wanting a Production designer, theatre/television/film to contact her about her son prescription error.  ? ?She said it has to be EVEKEO, not generic brand.  ?Told pt someone will contact her on her cell ? ?Andrew Christian [phone] ? ? ? ?

## 2021-04-17 NOTE — Telephone Encounter (Signed)
(  See all previous notes regarding this medication) ? ?Patient mom, Andrew Christian calling to speak to manager regarding prescription error.  Mom stated she has been waiting for 3 weeks to finally get this resolved and she picks up prescription from pharmacy, and prescription has been filled Generic, and she specified medication to be brand name only. ? ?Amphetamine Sulfate (EVEKEO) 10 MG TABS [[id number]]  ? ?Andrew Christian can be reached at [phone] and is requesting a call back immediately.  ?

## 2021-04-17 NOTE — Telephone Encounter (Signed)
This nurse called and spoke with mother to confirm that script was sent to the pharmacy as brand name, no generic. Mother says she picked up the generic and ended up paying $200 because it wasn't covered. This nurse suggested that mother speaks with pharmacist to make sure they are not filling an old script and adhering to the brand name only script. Mother will follow up with pharmacy and call us back if there are any other issues.  ?

## 2021-05-07 ENCOUNTER — Other Ambulatory Visit: Payer: Self-pay

## 2021-05-07 NOTE — Telephone Encounter (Signed)
Patient mom requesting refill on meds. ?She states her insurance will only pay for brand name, requests do not add ODT at the end of prescription name. ?Mom, Cala Bradford, can be reached at [phone] ? ?[facility] [place] ? ?(EVEKEO) 10 MG TABS  ? ? ?

## 2021-05-07 NOTE — Addendum Note (Signed)
Addended by: Emi Holes D on: 05/07/2021 02:21 PM ? ? Modules accepted: Orders ? ?

## 2021-05-07 NOTE — Telephone Encounter (Signed)
Requesting: Evekeo ?Contract: 01/25/20 ?UDS: 02/13/21 ?Last Visit: 02/13/21 ?Next Visit: advised to f/u 6 months ?Last Refill: 04/15/21(60,0) ? ?Please Advise. Med pending for dispense as written for brand ?

## 2021-05-08 MED ORDER — EVEKEO 10 MG PO TABS: ORAL_TABLET | ORAL | 0 refills | Status: DC

## 2021-07-04 ENCOUNTER — Ambulatory Visit: Payer: 59 | Admitting: Orthopedic Surgery

## 2021-08-02 ENCOUNTER — Ambulatory Visit (INDEPENDENT_AMBULATORY_CARE_PROVIDER_SITE_OTHER): Payer: 59 | Admitting: Orthopedic Surgery

## 2021-08-02 DIAGNOSIS — S43431A Superior glenoid labrum lesion of right shoulder, initial encounter: Secondary | ICD-10-CM | POA: Diagnosis not present

## 2021-08-07 ENCOUNTER — Encounter: Payer: Self-pay | Admitting: Orthopedic Surgery

## 2021-08-07 NOTE — Progress Notes (Signed)
Office Visit Note   Patient: Andrew Christian           Date of Birth: [birth year]           MRN: [id number] Visit Date: 08/02/2021 Requested by: Jeoffrey Massed, MD [address] [place],  [place] [address] PCP: Jeoffrey Massed, MD  Subjective: Chief Complaint  Patient presents with   Right Shoulder - Follow-up    right shoulder arthroscopy and labral repair 01/21/2021.    HPI: Andrew Christian is a 21 year old patient with right shoulder labral repair performed 1222.  Doing well with no problems.  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  States he is feeling normal around late April.  Did not do the spring season football.  Starting to do noncontact things in early August.  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  He will be full code mid August.              ROS: All systems reviewed are negative as they relate to the chief complaint within the history of present illness.  Patient denies  fevers or chills.   Assessment & Plan: Visit Diagnoses: No diagnosis found.  Plan: Impression is patient is doing well following right shoulder arthroscopy and labral repair.  He is done a good job of rehabilitating the shoulder.  Plan at this time is continue his rehab and follow-up as needed.  I do not think based on the stability he has that a Andrew Christian brace would necessarily be beneficial but he is at some risk returning to contact sports of having recurrent instability.  For now his shoulder does feel very stable.  Strength is excellent.  Follow-up as needed.  Follow-Up Instructions: Return if symptoms worsen or fail to improve.   Orders:  No orders of the defined types were placed in this encounter.  No orders of the defined types were placed in this encounter.     Procedures: No procedures performed   Clinical Data: No additional findings.  Objective: Vital Signs: There were no vitals taken for this  visit.  Physical Exam:   Constitutional: Patient appears well-developed HEENT:  Head: Normocephalic Eyes:EOM are normal Neck: Normal range of motion Cardiovascular: Normal rate Pulmonary/chest: Effort normal Neurologic: Patient is alert Skin: Skin is warm Psychiatric: Patient has normal mood and affect   Ortho Exam: Ortho exam demonstrates on the left external rotation to about 70 on the right 50.  Forward flexion is about 165 on the right and 175 on the left.  Isolated glenohumeral abduction is around 110 bilaterally.  Shoulder stability is excellent to anterior testing on the right and left-hand side.  No grinding or crepitus with internal/external rotation of the arm at 90 degrees of abduction.  Rotator cuff strength is excellent on the right infraspinatus supraspinatus and subscap muscle testing.  Specialty Comments:  No specialty comments available.  Imaging: No results found.   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   Chronic seasonal allergic rhinitis 04/25/2016   GAD (generalized anxiety disorder) 01/14/2015   Attention deficit hyperactivity disorder (ADHD), predominantly inattentive type 01/14/2015   Past Medical History:  Diagnosis Date   ADHD (attention deficit hyperactivity disorder)    managed by psych   Anxiety    managed by psych   Medial meniscus tear 07/2016   right knee  Obesity, Class I, BMI 30-34.9     Family History  Problem Relation Age of Onset   Thyroid disease Mother    High Cholesterol Father    ADD / ADHD Sister    Thyroid disease Maternal Grandmother    Heart disease Maternal Grandfather    Heart attack Maternal Grandfather    High Cholesterol Maternal Grandfather    Depression Paternal Grandmother     Past Surgical History:  Procedure Laterality Date   KNEE ARTHROSCOPY Bilateral    SHOULDER ARTHROSCOPY Right 01/2021   Right glenoid labral  repair   Social History   Occupational History   Not on file  Tobacco Use   Smoking status: Never   Smokeless tobacco: Never  Vaping Use   Vaping Use: Never used  Substance and Sexual Activity   Alcohol use: No    Alcohol/week: 0.0 standard drinks of alcohol   Drug use: No   Sexual activity: Never

## 2021-08-09 ENCOUNTER — Ambulatory Visit: Payer: 59 | Admitting: Family Medicine

## 2021-08-23 ENCOUNTER — Encounter: Payer: Self-pay | Admitting: Family Medicine

## 2021-08-23 ENCOUNTER — Ambulatory Visit (INDEPENDENT_AMBULATORY_CARE_PROVIDER_SITE_OTHER): Payer: 59 | Admitting: Family Medicine

## 2021-08-23 VITALS — BP 99/64 | HR 67 | Temp 98.2°F | Ht 72.0 in | Wt 230.8 lb

## 2021-08-23 DIAGNOSIS — F902 Attention-deficit hyperactivity disorder, combined type: Secondary | ICD-10-CM

## 2021-08-23 MED ORDER — EVEKEO 10 MG PO TABS: ORAL_TABLET | ORAL | 0 refills | Status: DC

## 2021-08-23 NOTE — Progress Notes (Signed)
OFFICE VISIT  08/23/2021  CC:  Chief Complaint  Patient presents with   ADHD        Patient is a 21 y.o. male who presents for 7-month follow-up GAD and ADHD. A/P as of last visit: "#1 ADHD, combined type. Stable, continue evekeo 10mg , 2 tabs qAM.  Had to switch to the regular tab rather than the oral disintegrating form due to ODT not being available at pharmacies lately. I did electronic rx's for this med today for each of the next 3 mo.  Appropriate fill on/after date was noted on each rx. Controlled substance contract up-to-date.   #2 GAD.  Doing well, stable on sertraline 100 mg daily at this time. He will consider weaning off of this medication once all his classes are finished this summer"  INTERIM HX: [place] is doing well. 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 Plans on getting a job with January in [place].  Pt states all is going well with the med at current dosing, Evekeo 10 mg tabs, 2 tabs daily: much improved focus, concentration, task completion.  Less frustration, better multitasking, less impulsivity and restlessness.  Mood is stable. No side effects from the medication.  He continues to take sertraline 100 mg a day and feels like his anxiety level and mood are good  PMP AWARE reviewed today: most recent rx for Evekeo was filled 07/20/2021, #60, rx by me. No red flags.  Past Medical History:  Diagnosis Date   ADHD (attention deficit hyperactivity disorder)    managed by psych   Anxiety    managed by psych   Medial meniscus tear 07/2016   right knee   Obesity, Class I, BMI 30-34.9     Past Surgical History:  Procedure Laterality Date   KNEE ARTHROSCOPY Bilateral    SHOULDER ARTHROSCOPY Right 01/2021   Right glenoid labral repair    Outpatient Medications Prior to Visit  Medication Sig Dispense Refill   ampicillin (PRINCIPEN) 500 MG capsule Take 500 mg by mouth 4 (four) times daily.     sertraline (ZOLOFT) 100 MG  tablet 1 tab po qd 90 tablet 3   EVEKEO 10 MG TABS TAKE 2 TABLETS BY MOUTH DAILY. 60 tablet 0   No facility-administered medications prior to visit.    No Known Allergies  ROS As per HPI  PE:    08/23/2021    9:09 AM 02/13/2021   11:46 AM 08/24/2020   10:00 AM  Vitals with BMI  Height 6\' 0"  6\' 0"  6\' 0"   Weight 230 lbs 13 oz 231 lbs 6 oz 225 lbs  BMI 31.3 31.38 30.51  Systolic 99 115   Diastolic 64 76   Pulse 67 63    Physical Exam  Gen: Alert, well appearing.  Patient is oriented to person, place, time, and situation.. AFFECT: pleasant, lucid thought and speech. No further exam today.  LABS:  none     IMPRESSION AND PLAN:  #1 ADHD. Stable long-term on Evekeo 10 mg tabs, 2 tabs per day. 90-day supply prescribed today. Urine tox screen today--of note, he recently got his wisdom teeth out and was prescribed some hydrocodone by his dentist so this may show up in his urine.  An After Visit Summary was printed and given to the patient.  FOLLOW UP: Return in about 6 months (around 02/23/2022) for f/u ADD.  Signed:  , MD           08/23/2021

## 2021-08-26 LAB — DRUG MONITORING PANEL 376104, URINE
Amphetamine: 2871 ng/mL — ABNORMAL HIGH (ref <250)
Amphetamines: POSITIVE ng/mL — AB (ref <500)
Barbiturates: NEGATIVE ng/mL (ref <300)
Benzodiazepines: NEGATIVE ng/mL (ref <100)
Cocaine Metabolite: NEGATIVE ng/mL (ref <150)
Desmethyltramadol: NEGATIVE ng/mL (ref <100)
Methamphetamine: NEGATIVE ng/mL (ref <250)
Opiates: NEGATIVE ng/mL (ref <100)
Oxycodone: NEGATIVE ng/mL (ref <100)
Tramadol: NEGATIVE ng/mL (ref <100)

## 2021-08-26 LAB — DM TEMPLATE

## 2021-08-28 ENCOUNTER — Telehealth: Payer: Self-pay

## 2021-08-28 MED ORDER — EVEKEO ODT 10 MG PO TBDP: ORAL_TABLET | ORAL | 0 refills | Status: DC

## 2021-08-28 NOTE — Telephone Encounter (Signed)
Received fax for [facility] drug change. Spoke with mother and was advised that Rx will need to be changed to the generic brand. She states that pharmacies are having a problem getting brand med and the insurance will cover the generic brand for Evekeo. Pt was last seen on 08/23/21.   Please advise

## 2021-08-28 NOTE — Addendum Note (Signed)
Addended by: Jeoffrey Massed on: 08/28/2021 04:38 PM   Modules accepted: Orders

## 2021-08-28 NOTE — Telephone Encounter (Signed)
Pt's mother, Selena Batten advised regarding rx change.

## 2021-08-28 NOTE — Telephone Encounter (Signed)
Molli Knock, generic evekeo sent

## 2021-09-02 ENCOUNTER — Telehealth: Payer: Self-pay

## 2021-09-02 NOTE — Telephone Encounter (Signed)
Andrew Christian (Key: [id number])   PA in process, waiting for determination by insurance.

## 2021-09-02 NOTE — Telephone Encounter (Signed)
Pt's mother called stating that the Rx for generic eveko will need a PA completed. If it is denied there will need to be an appeal done.

## 2021-09-02 NOTE — Telephone Encounter (Signed)
Pt's mother dropped off school form to be completed and states that pt is needing them tomorrow. Forms placed in PCP basket

## 2021-09-03 NOTE — Telephone Encounter (Signed)
Placed on PCP desk to review and sign, if appropriate.  

## 2021-09-03 NOTE — Telephone Encounter (Signed)
Working on form completion

## 2021-09-03 NOTE — Telephone Encounter (Signed)
Evekeo ODT 10MG  dispersible tablets Status: PA Response - Denied

## 2021-09-04 NOTE — Telephone Encounter (Signed)
Pt's mother called stating that she called the insurance and was told that it would have been approved if the correct information was in. It will need to say that pt has tried prior medications that have not worked for him. This will allow the PA to be approved.

## 2021-09-04 NOTE — Telephone Encounter (Signed)
[employer] and initiated another PA over phone. Ref # [id number] and determination will take 4 days.

## 2021-09-04 NOTE — Telephone Encounter (Signed)
Signed and put in box to go up front. Signed:  Santiago Bumpers, MD           09/04/2021

## 2021-09-04 NOTE — Telephone Encounter (Addendum)
Will have to call [phone] for appeal; appeal not cannot be done over the phone. Most relevant documentation( last ov 7/21) attached to PA submitted. OV note faxed to appeals department. Pt's mother advised regarding update. She will contact them.   Andrew Christian (Key: [place]) [id number] Amphetamine Sulfate 10MG  tablets Status: PA Response - Denied Created: August 1st, 2023Sent: August 1st, 2023  The requested medication and/or diagnosis are not a covered benefit and excluded from coverage in accordance with the terms and conditions of your plan benefit. Therefore, the request has been administratively denied.  To file an appeal, please send any written comments, documents or other relevant documentation with your appeal to the address listed below: August 3rd, 2023 [address] [place], [place] Phone: Please call the toll-free member number listed on your health plan ID card. Fax: [phone]

## 2022-02-24 ENCOUNTER — Other Ambulatory Visit: Payer: Self-pay | Admitting: Family Medicine

## 2022-02-24 MED ORDER — EVEKEO ODT 10 MG PO TBDP
ORAL_TABLET | ORAL | 0 refills | Status: DC
Start: 2022-02-24 — End: 2022-03-03

## 2022-02-24 NOTE — Telephone Encounter (Signed)
Pt's mom called and patient is needing refill on Evekeo and was told to mark allow for substitutions because its been hard to get this in.

## 2022-02-24 NOTE — Telephone Encounter (Signed)
RequestingIrine Seal Contract: 02/13/21 UDS: 08/23/21 Last Visit: 08/23/21 Next Visit: 6 month follow up not completed Last Refill: 08/28/21 (60,0)  Please Advise. Med pending. See message below regarding rx

## 2022-03-03 MED ORDER — AMPHETAMINE SULFATE 10 MG PO TBDP
ORAL_TABLET | ORAL | 0 refills | Status: DC
Start: 2022-03-03 — End: 2022-03-04

## 2022-03-03 NOTE — Addendum Note (Signed)
Addended by: Tammi Sou on: 03/03/2022 04:35 PM   Modules accepted: Orders

## 2022-03-03 NOTE — Telephone Encounter (Signed)
Pt's mother advised updated rx sent to pharmacy.

## 2022-03-03 NOTE — Telephone Encounter (Signed)
Pt's mother said the prescription needs to changed to amphetamine sulfate without the eveko odt attached. You can still out allow for substitutions.

## 2022-03-03 NOTE — Telephone Encounter (Signed)
Ok done

## 2022-03-03 NOTE — Telephone Encounter (Signed)
Pts mom called and stated the prescription was still wrong and that something should not be stated on the script. I placed her on a brief hold and she hung up. Please reach out to the patient regarding this matter.

## 2022-03-04 ENCOUNTER — Other Ambulatory Visit: Payer: Self-pay | Admitting: Family Medicine

## 2022-03-04 MED ORDER — AMPHETAMINE SULFATE 10 MG PO TABS
ORAL_TABLET | ORAL | 0 refills | Status: DC
Start: 2022-03-04 — End: 2022-05-08

## 2022-03-04 NOTE — Telephone Encounter (Signed)
Pt's mother advised refill sent.

## 2022-03-04 NOTE — Telephone Encounter (Signed)
Mom called back stating there was still an issue with rx. Confirmed with pharmacy, insurance will cover evekeo 10mg  po tabs or amphetamine sulfate 10mg  po tabs, not TBDP.  Rx pending. Please approve rx change

## 2022-03-04 NOTE — Addendum Note (Signed)
Addended by: Deveron Furlong D on: 03/04/2022 09:56 AM   Modules accepted: Orders

## 2022-03-07 ENCOUNTER — Other Ambulatory Visit: Payer: Self-pay

## 2022-03-07 MED ORDER — SERTRALINE HCL 100 MG PO TABS: ORAL_TABLET | ORAL | 0 refills | Status: DC

## 2022-04-03 ENCOUNTER — Other Ambulatory Visit: Payer: Self-pay | Admitting: Family Medicine

## 2022-05-03 ENCOUNTER — Other Ambulatory Visit: Payer: Self-pay | Admitting: Family Medicine

## 2022-05-06 ENCOUNTER — Telehealth: Payer: Self-pay | Admitting: Family Medicine

## 2022-05-06 ENCOUNTER — Other Ambulatory Visit: Payer: Self-pay | Admitting: Family Medicine

## 2022-05-06 NOTE — Telephone Encounter (Signed)
Pt is requesting a partial refill for sertraline until his appt on this Friday. He uses the [facility] in [place].

## 2022-05-06 NOTE — Telephone Encounter (Signed)
Patient is scheduled for Friday 4/5/, however he is asking if a couple of pills of Sertraline  can be called in until his appointment on Friday.

## 2022-05-07 MED ORDER — SERTRALINE HCL 100 MG PO TABS: ORAL_TABLET | ORAL | 1 refills | Status: DC

## 2022-05-07 NOTE — Telephone Encounter (Signed)
Ok, sertraline rx sent

## 2022-05-07 NOTE — Telephone Encounter (Signed)
Pt's mom advised refill sent. 

## 2022-05-09 ENCOUNTER — Ambulatory Visit (INDEPENDENT_AMBULATORY_CARE_PROVIDER_SITE_OTHER): Payer: 59 | Admitting: Family Medicine

## 2022-05-09 ENCOUNTER — Encounter: Payer: Self-pay | Admitting: Family Medicine

## 2022-05-09 VITALS — BP 111/79 | HR 65 | Wt 229.2 lb

## 2022-05-09 DIAGNOSIS — F411 Generalized anxiety disorder: Secondary | ICD-10-CM | POA: Diagnosis not present

## 2022-05-09 DIAGNOSIS — F902 Attention-deficit hyperactivity disorder, combined type: Secondary | ICD-10-CM | POA: Diagnosis not present

## 2022-05-09 MED ORDER — AMPHETAMINE SULFATE 10 MG PO TABS: ORAL_TABLET | ORAL | 0 refills | Status: DC

## 2022-05-09 NOTE — Progress Notes (Signed)
OFFICE VISIT  05/09/2022  CC:  Chief Complaint  Patient presents with   Medication Refill    Med refills no other questions or concerns.    Patient is a 22 y.o. male who presents for follow-up ADHD. I last saw him 8 mo ago. A/P as of that visit: "ADHD. Stable long-term on Evekeo 10 mg tabs, 2 tabs per day. 90-day supply prescribed today. Urine tox screen today--of note, he recently got his wisdom teeth out and was prescribed some hydrocodone by his dentist so this may show up in his urine."  INTERIM HX: Andrew Christian has been doing right. He is now working for [employer] in [place].  He is living with his parents and is paying off some loan debt and may look for a house next year.  He is playing rugby now.  Pt states all is going well with the med at current dosing: much improved focus, concentration, task completion.  Less frustration, better multitasking, less impulsivity and restlessness.  Mood is stable. No side effects from the medication. Anxiety level stable on sertraline 100 mg daily.  PMP AWARE reviewed today: most recent rx for amphetamine sulfate 10 mg tab was filled 03/04/2022, # 60, rx by me. No red flags.  Past Medical History:  Diagnosis Date   ADHD (attention deficit hyperactivity disorder)    managed by psych   Anxiety    managed by psych   Medial meniscus tear 07/2016   right knee   Obesity, Class I, BMI 30-34.9     Past Surgical History:  Procedure Laterality Date   KNEE ARTHROSCOPY Bilateral    SHOULDER ARTHROSCOPY Right 01/2021   Right glenoid labral repair    Outpatient Medications Prior to Visit  Medication Sig Dispense Refill   sertraline (ZOLOFT) 100 MG tablet 1 tab po qd 90 tablet 1   Amphetamine Sulfate (EVEKEO) 10 MG TABS 1 tab po bid 60 tablet 0   ampicillin (PRINCIPEN) 500 MG capsule Take 500 mg by mouth 4 (four) times daily. (Patient not taking: Reported on 05/09/2022)     No facility-administered medications prior to visit.    No Known  Allergies  Review of Systems As per HPI  PE:    05/09/2022    2:44 PM 08/23/2021    9:09 AM 02/13/2021   11:46 AM  Vitals with BMI  Height  6\' 0"  6\' 0"   Weight 229 lbs 3 oz 230 lbs 13 oz 231 lbs 6 oz  BMI  31.3 31.38  Systolic 111 99 115  Diastolic 79 64 76  Pulse 65 67 63    Physical Exam  Gen: Alert, well appearing.  Patient is oriented to person, place, time, and situation. AFFECT: pleasant, lucid thought and speech. No further exam today.  LABS:  none  IMPRESSION AND PLAN:  1) ADHD. Stable long-term on Evekeo (generic) 10 mg tabs, 2 tabs per day. #30 today for 1 month supply, sent prescriptions for each of the next 3 months.  2.  GAD, doing well long-term on sertraline 100 mg a day.  An After Visit Summary was printed and given to the patient.  FOLLOW UP: Return in about 6 months (around 11/08/2022) for routine chronic illness f/u.  Signed:  Santiago Bumpers, MD           05/09/2022

## 2022-06-21 IMAGING — DX DG SHOULDER 2+V*R*
3 series · 3 of 3 positions shown · non-contrast
Comparison: None.

CLINICAL DATA: Right shoulder pain for 1 month since an injury
playing football. Initial encounter.

EXAM:
RIGHT SHOULDER - 2+ VIEW

[shoulder ap (1 of 2)]
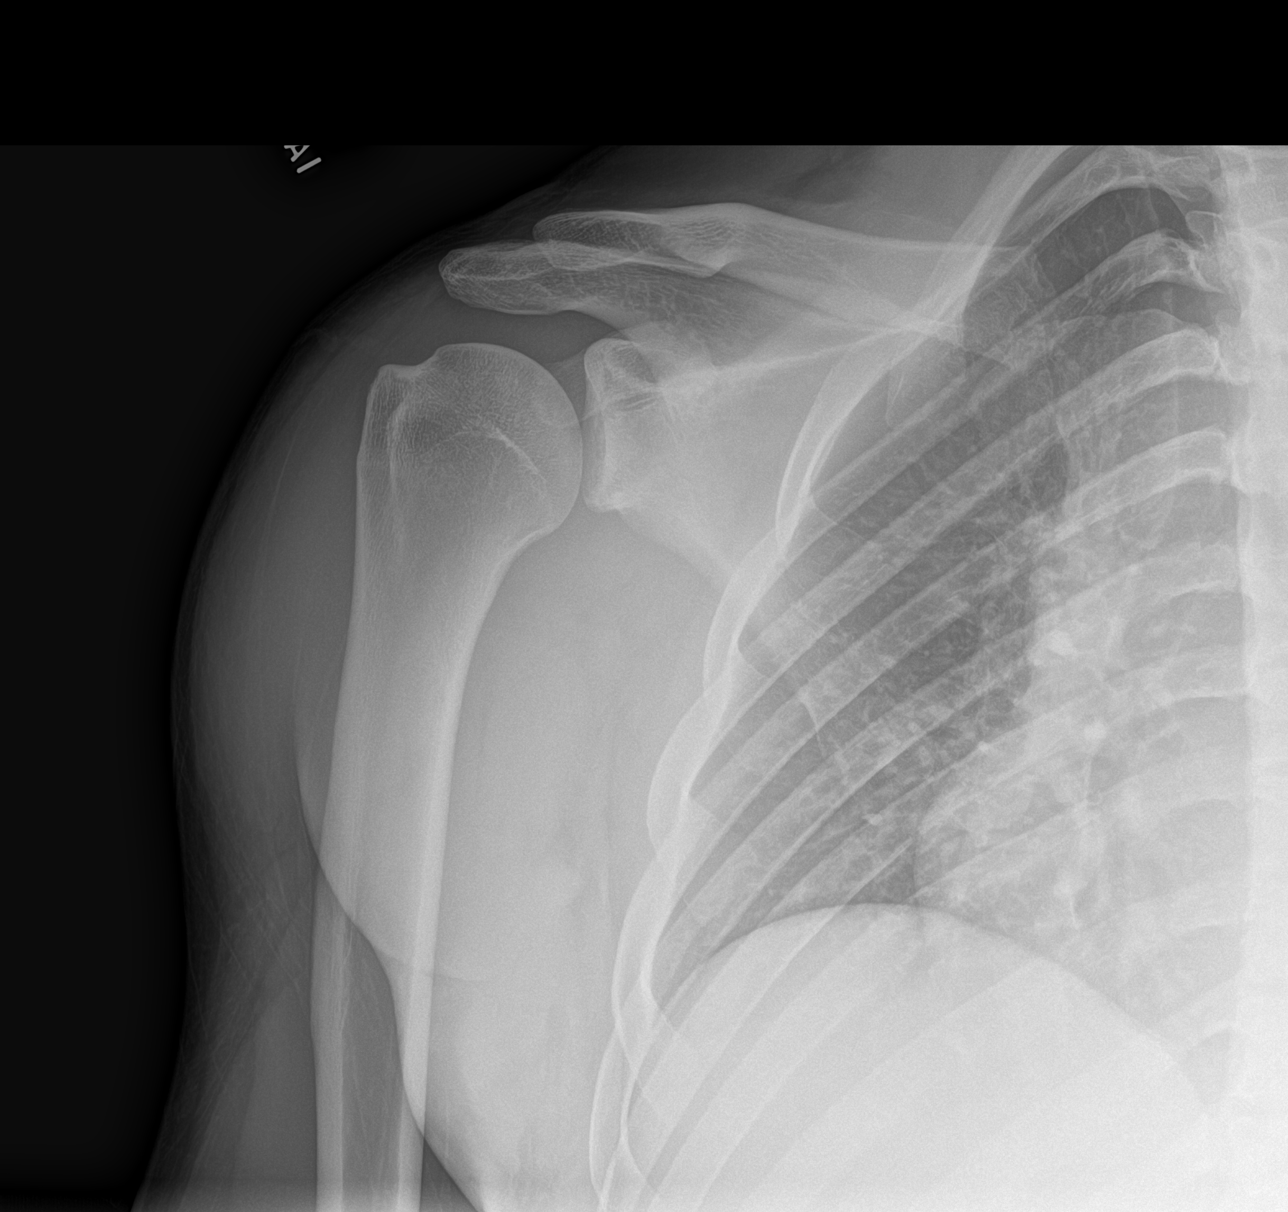

[shoulder ap (2 of 2)]
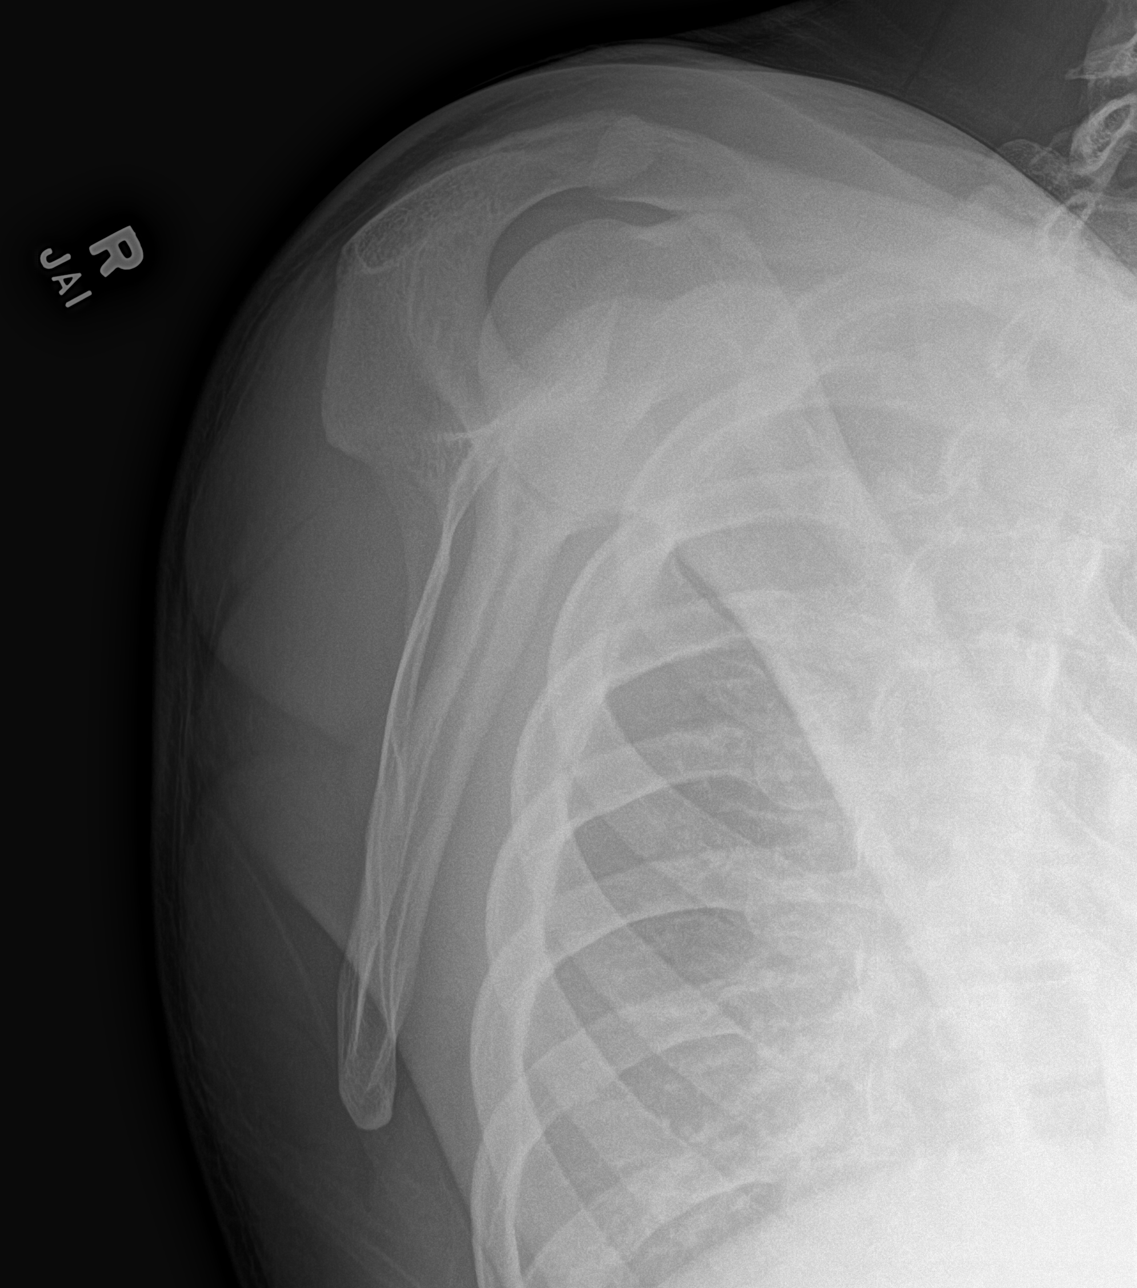

[shoulder axial]
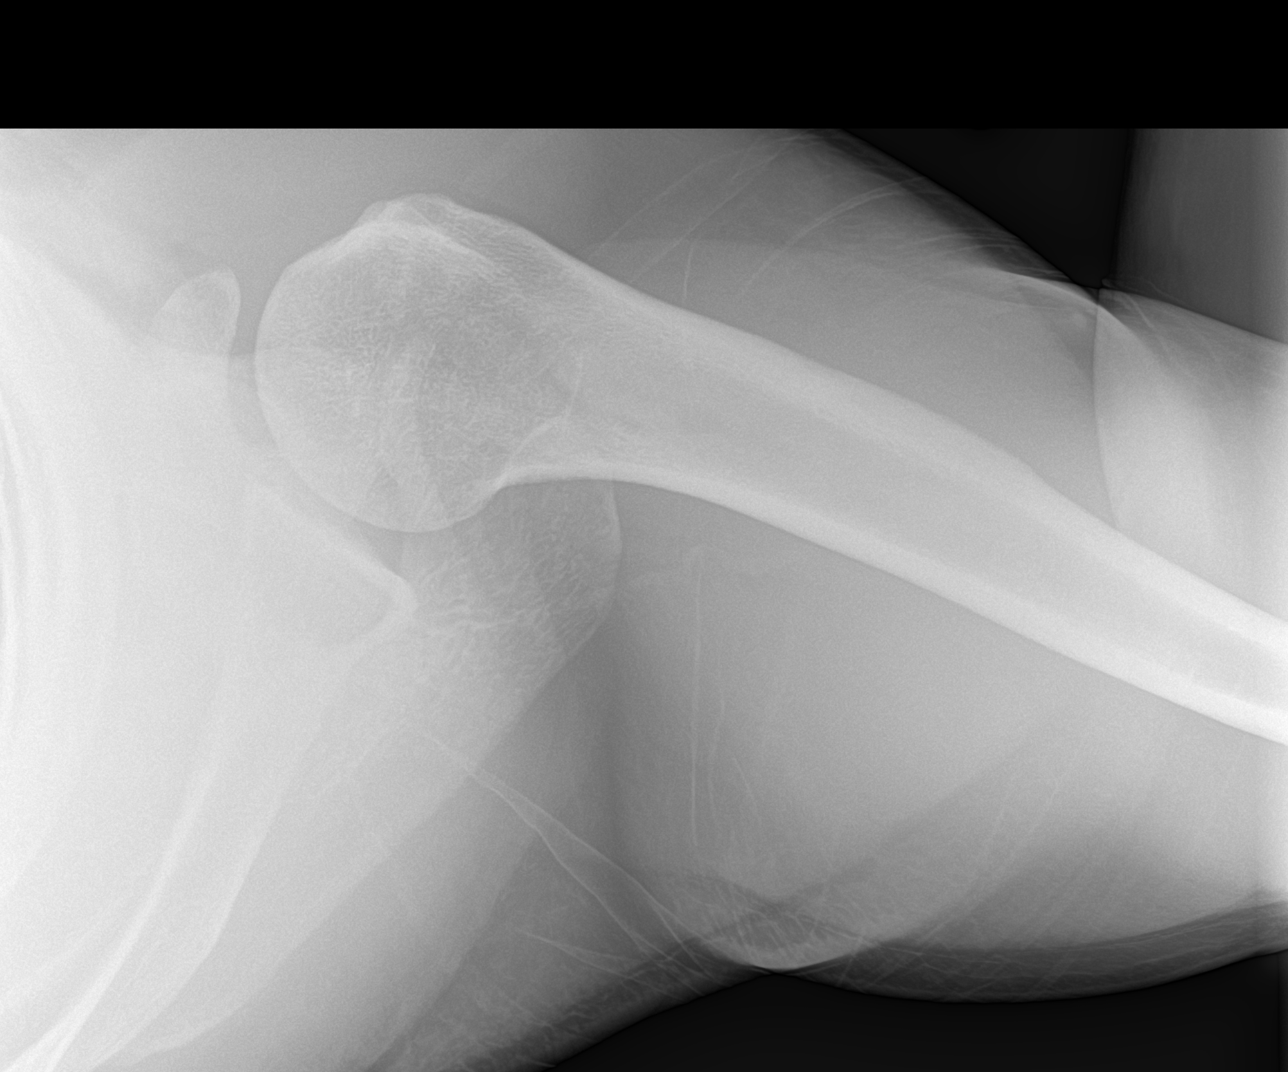

[3 of 3 positions shown; findings below may reference images not displayed]

FINDINGS: There is no evidence of fracture or dislocation. There is no
evidence of arthropathy or other focal bone abnormality. Soft
tissues are unremarkable.
IMPRESSION: Normal exam.

## 2022-10-31 ENCOUNTER — Other Ambulatory Visit: Payer: Self-pay

## 2022-10-31 MED ORDER — SERTRALINE HCL 100 MG PO TABS: ORAL_TABLET | ORAL | 0 refills | Status: DC

## 2022-10-31 NOTE — Telephone Encounter (Signed)
RF request for sertraline (ZOLOFT) 100 MG tablet  LOV: 05/09/22 Next ov: advised to f/u 6 months Last written: 05/07/22 (90,1)  Pt given 30 d/s until an appt is scheduled.

## 2022-11-14 ENCOUNTER — Encounter: Payer: Self-pay | Admitting: Family Medicine

## 2022-11-14 ENCOUNTER — Ambulatory Visit: Payer: 59 | Admitting: Family Medicine

## 2022-11-14 VITALS — BP 109/65 | HR 61 | Temp 97.7°F | Ht 72.0 in | Wt 225.2 lb

## 2022-11-14 DIAGNOSIS — Z79899 Other long term (current) drug therapy: Secondary | ICD-10-CM | POA: Diagnosis not present

## 2022-11-14 DIAGNOSIS — F411 Generalized anxiety disorder: Secondary | ICD-10-CM

## 2022-11-14 DIAGNOSIS — F909 Attention-deficit hyperactivity disorder, unspecified type: Secondary | ICD-10-CM

## 2022-11-14 MED ORDER — AMPHETAMINE SULFATE 10 MG PO TABS: ORAL_TABLET | ORAL | 0 refills | Status: DC

## 2022-11-14 MED ORDER — SERTRALINE HCL 100 MG PO TABS: ORAL_TABLET | ORAL | 1 refills | Status: DC

## 2022-11-14 NOTE — Progress Notes (Signed)
OFFICE VISIT  11/14/2022  CC:  Chief Complaint  Patient presents with   Medical Management of Chronic Issues    Patient is a 22 y.o. male who presents for 69-month follow-up ADD and GAD. A/P as of last visit: "1) ADHD. Stable long-term on Evekeo (generic) 10 mg tabs, 2 tabs per day. #30 today for 1 month supply, sent prescriptions for each of the next 3 months.   2.  GAD, doing well long-term on sertraline 100 mg a day."  INTERIM HX: [place] is doing well. Working at [employer], still playing rugby regularly.  Pt states all is going well with the med at current dosing (Evekeo 10mg  tabs, 2 qAM): much improved focus, concentration, task completion.  Less frustration, better multitasking, less impulsivity and restlessness.  Mood is stable. No side effects from the medication.   PMP AWARE reviewed today: most recent rx for amphetamine sulfate 10 mg tabs was filled 08/17/2022, # 60, rx by me. No red flags.  Past Medical History:  Diagnosis Date   ADHD (attention deficit hyperactivity disorder)    managed by psych   Anxiety    Medial meniscus tear 07/2016   right knee   Obesity, Class I, BMI 30-34.9     Past Surgical History:  Procedure Laterality Date   KNEE ARTHROSCOPY Bilateral    SHOULDER ARTHROSCOPY Right 01/2021   Right glenoid labral repair    Outpatient Medications Prior to Visit  Medication Sig Dispense Refill   Amphetamine Sulfate (EVEKEO) 10 MG TABS 1 tab po bid 60 tablet 0   sertraline (ZOLOFT) 100 MG tablet 1 tab po every day, OFFICE VISIT NEEDED FOR FURTHER REFILLS 30 tablet 0   No facility-administered medications prior to visit.    No Known Allergies  Review of Systems As per HPI  PE:    11/14/2022    3:43 PM 05/09/2022    2:44 PM 08/23/2021    9:09 AM  Vitals with BMI  Height 6\' 0"   6\' 0"   Weight 225 lbs 3 oz 229 lbs 3 oz 230 lbs 13 oz  BMI 30.54  31.3  Systolic 109 111 99  Diastolic 65 79 64  Pulse 61 65 67     Physical Exam  Gen: Alert,  well appearing.  Patient is oriented to person, place, time, and situation. AFFECT: pleasant, lucid thought and speech.   LABS:  none  IMPRESSION AND PLAN:  1) ADHD. Stable long-term on Evekeo (generic) 10 mg tabs, 2 tabs per day. Rx sent today. CSC updated. UDS today.   2.  GAD, doing well long-term on sertraline 100 mg a day.  An After Visit Summary was printed and given to the patient.  FOLLOW UP: Return in about 6 months (around 05/15/2023) for f/u ADD/anx.  Signed:  Santiago Bumpers, MD           11/14/2022

## 2022-11-16 LAB — DRUG MONITORING PANEL 376104, URINE
Barbiturates: NEGATIVE ng/mL (ref <300)
Benzodiazepines: NEGATIVE ng/mL (ref <100)
Cocaine Metabolite: NEGATIVE ng/mL (ref <150)
Desmethyltramadol: NEGATIVE ng/mL (ref <100)
Methamphetamine: 287 ng/mL — ABNORMAL HIGH (ref <250)
Opiates: NEGATIVE ng/mL (ref <100)
Oxycodone: NEGATIVE ng/mL (ref <100)
Tramadol Comments: 3136 ng/mL — ABNORMAL HIGH (ref <250)
Tramadol: NEGATIVE ng/mL (ref <100)
Tramadol: POSITIVE ng/mL — AB (ref <500)
medMATCH Summary: NEGATIVE ng/mL (ref <100)

## 2022-11-16 LAB — DM TEMPLATE

## 2023-01-19 ENCOUNTER — Telehealth: Payer: Self-pay | Admitting: Family Medicine

## 2023-01-19 MED ORDER — AMPHETAMINE SULFATE 10 MG PO TABS: ORAL_TABLET | ORAL | 0 refills | Status: DC

## 2023-01-19 NOTE — Telephone Encounter (Signed)
Prescription Request  01/19/2023  LOV: 11/14/2022  What is the name of the medication or equipment? Amphetamine Sulfate (EVEKEO) 10 MG TABS   Have you contacted your pharmacy to request a refill? Yes   Which pharmacy would you like this sent to?  [id number] - [place], [place] - [address] [place] [address] Phone: [phone] Fax: [phone]    Patient notified that their request is being sent to the clinical staff for review and that they should receive a response within 2 business days.   Please advise at Mobile [phone] (mobile)

## 2023-01-19 NOTE — Telephone Encounter (Signed)
Refill requested for Amphetamine sent to [facility] in [place].   Next OV not due until 05/15/23

## 2023-01-19 NOTE — Telephone Encounter (Signed)
Okay, prescription sent 

## 2023-03-18 NOTE — Patient Instructions (Signed)

## 2023-03-19 ENCOUNTER — Encounter: Payer: Self-pay | Admitting: Family Medicine

## 2023-03-19 ENCOUNTER — Ambulatory Visit: Payer: 59 | Admitting: Family Medicine

## 2023-03-19 VITALS — BP 113/73 | HR 60 | Temp 98.0°F | Ht 72.0 in | Wt 223.2 lb

## 2023-03-19 DIAGNOSIS — F411 Generalized anxiety disorder: Secondary | ICD-10-CM

## 2023-03-19 DIAGNOSIS — F900 Attention-deficit hyperactivity disorder, predominantly inattentive type: Secondary | ICD-10-CM | POA: Diagnosis not present

## 2023-03-19 MED ORDER — AMPHETAMINE SULFATE 10 MG PO TABS: ORAL_TABLET | ORAL | 0 refills | Status: DC

## 2023-03-19 MED ORDER — SERTRALINE HCL 100 MG PO TABS: ORAL_TABLET | ORAL | 1 refills | Status: DC

## 2023-03-19 NOTE — Progress Notes (Signed)
OFFICE VISIT  03/19/2023  CC:  Chief Complaint  Patient presents with   Medical Management of Chronic Issues    Patient is a 23 y.o. male who presents for 73-month follow-up ADD and anxiety. A/P as of last visit: "1) ADHD. Stable long-term on Evekeo (generic) 10 mg tabs, 2 tabs per day. CSC and UDS UTD.   2)  GAD, doing well long-term on sertraline 100 mg a day."  INTERIM HX: Doing very well. Working hard at [employer]. Playing rugby still.  He feels like his mood is good/stable and his anxiety levels are manageable.  Pt states all is going well with the med at current dosing: much improved focus, concentration, task completion.  Less frustration, better multitasking, less impulsivity and restlessness.  Mood is stable. No side effects from the medication.   PMP AWARE reviewed today: most recent rx for amphetamine sulfate 10mg  was filled 01/19/23, # 60, rx by me. No red flags.  Past Medical History:  Diagnosis Date   ADHD (attention deficit hyperactivity disorder)    managed by psych   Anxiety    Medial meniscus tear 07/2016   right knee   Obesity, Class I, BMI 30-34.9     Past Surgical History:  Procedure Laterality Date   KNEE ARTHROSCOPY Bilateral    SHOULDER ARTHROSCOPY Right 01/2021   Right glenoid labral repair    Outpatient Medications Prior to Visit  Medication Sig Dispense Refill   Amphetamine Sulfate (EVEKEO) 10 MG TABS 1 tab po bid 60 tablet 0   sertraline (ZOLOFT) 100 MG tablet 1 tab po every day 90 tablet 1   No facility-administered medications prior to visit.    No Known Allergies  Review of Systems As per HPI  PE:    03/19/2023    7:54 AM 11/14/2022    3:43 PM 05/09/2022    2:44 PM  Vitals with BMI  Height 6\' 0"  6\' 0"    Weight 223 lbs 3 oz 225 lbs 3 oz 229 lbs 3 oz  BMI 30.26 30.54   Systolic 113 109 657  Diastolic 73 65 79  Pulse 60 61 65     Physical Exam  Wt Readings from Last 2 Encounters:  03/19/23 223 lb 3.2 oz (101.2 kg)   11/14/22 225 lb 3.2 oz (102.2 kg)    Gen: alert, oriented x 4, affect pleasant.  Lucid thinking and conversation noted. HEENT: PERRLA, EOMI.   Neck: no LAD, mass, or thyromegaly. CV: RRR, no m/r/g LUNGS: CTA bilat, nonlabored. NEURO: no tremor or tics noted on observation.  Coordination intact. CN 2-12 grossly intact bilaterally, strength 5/5 in all extremeties.  No ataxia.   LABS:  none  IMPRESSION AND PLAN:  1) ADHD. Stable long-term on Evekeo (generic) 10 mg tabs, 2 tabs per day. CSC and UDS UTD.   2)  GAD, doing well long-term on sertraline 100 mg a day. RF#90, additional RF x 1 today.  An After Visit Summary was printed and given to the patient.  FOLLOW UP: Return in about 6 months (around 09/16/2023) for routine chronic illness f/u.  Signed:  Santiago Bumpers, MD           03/19/2023

## 2023-05-22 ENCOUNTER — Other Ambulatory Visit: Payer: Self-pay | Admitting: Family Medicine

## 2023-05-22 NOTE — Telephone Encounter (Signed)
 Copied from CRM [id number]. Topic: Clinical - Medication Refill >> May 22, 2023  8:08 AM Albertha Alosa wrote: Most Recent Primary Care Visit:  Provider: Shelvia Dick  Department: [facility]  Visit Type: OFFICE VISIT  Date: 03/19/2023  Medication: Amphetamine  Sulfate (EVEKEO ) 10 MG TABS  Has the patient contacted their pharmacy? Yes (Agent: If no, request that the patient contact the pharmacy for the refill. If patient does not wish to contact the pharmacy document the reason why and proceed with request.) (Agent: If yes, when and what did the pharmacy advise?)  Is this the correct pharmacy for this prescription? Yes If no, delete pharmacy and type the correct one.  This is the patient's preferred pharmacy:  [id number] - [place], [place] - [address] [place] [address] Phone: [phone] Fax: [phone]   Has the prescription been filled recently? No  Is the patient out of the medication? Yes  Has the patient been seen for an appointment in the last year OR does the patient have an upcoming appointment? Yes  Can we respond through MyChart? Yes  Agent: Please be advised that Rx refills may take up to 3 business days. We ask that you follow-up with your pharmacy.

## 2023-05-25 ENCOUNTER — Other Ambulatory Visit: Payer: Self-pay | Admitting: Family Medicine

## 2023-05-25 NOTE — Telephone Encounter (Deleted)
 Copied from CRM [id number]. Topic: Clinical - Medication Refill >> May 25, 2023 10:05 AM Jethro Morrison wrote: Most Recent Primary Care Visit:  Provider: Shelvia Dick  Department: [facility]  Visit Type: OFFICE VISIT  Date: 03/19/2023  Medication: Amphetamine  Sulfate (EVEKEO ) 10 MG TABS  Has the patient contacted their pharmacy? Yes (Agent: If no, request that the patient contact the pharmacy for the refill. If patient does not wish to contact the pharmacy document the reason why and proceed with request.) (Agent: If yes, when and what did the pharmacy advise?)  Is this the correct pharmacy for this prescription? Yes If no, delete pharmacy and type the correct one.  This is the patient's preferred pharmacy:  [id number] - [place], [place] - [address] [place] [address] Phone: [phone] Fax: [phone]   Has the prescription been filled recently? Yes  Is the patient out of the medication? Yes  Has the patient been seen for an appointment in the last year OR does the patient have an upcoming appointment? Yes  Can we respond through MyChart? No  Agent: Please be advised that Rx refills may take up to 3 business days. We ask that you follow-up with your pharmacy.

## 2023-05-26 ENCOUNTER — Other Ambulatory Visit: Payer: Self-pay | Admitting: Family Medicine

## 2023-05-26 MED ORDER — AMPHETAMINE SULFATE 10 MG PO TABS: ORAL_TABLET | ORAL | 0 refills | Status: DC

## 2023-05-26 NOTE — Telephone Encounter (Signed)
 Refill requested. Next OV not due until August

## 2023-05-27 NOTE — Telephone Encounter (Signed)
 I sent this prescription to [facility] yesterday.  Will you please make sure that they received it?

## 2023-07-22 ENCOUNTER — Ambulatory Visit (INDEPENDENT_AMBULATORY_CARE_PROVIDER_SITE_OTHER): Admitting: Family Medicine

## 2023-07-22 ENCOUNTER — Encounter: Payer: Self-pay | Admitting: Family Medicine

## 2023-07-22 VITALS — BP 107/71 | HR 69 | Temp 98.1°F | Ht 72.0 in | Wt 223.4 lb

## 2023-07-22 DIAGNOSIS — F411 Generalized anxiety disorder: Secondary | ICD-10-CM

## 2023-07-22 DIAGNOSIS — F900 Attention-deficit hyperactivity disorder, predominantly inattentive type: Secondary | ICD-10-CM

## 2023-07-22 MED ORDER — AMPHETAMINE SULFATE 10 MG PO TABS: ORAL_TABLET | ORAL | 0 refills | Status: DC

## 2023-07-22 MED ORDER — SERTRALINE HCL 100 MG PO TABS: ORAL_TABLET | ORAL | 3 refills | Status: AC

## 2023-07-22 NOTE — Progress Notes (Signed)
 OFFICE VISIT  07/22/2023  CC:  Chief Complaint  Patient presents with   Medical Management of Chronic Issues   Patient is a 23 y.o. male who presents for 41-month follow-up attention deficit disorder and GAD. A/P as of last visit: 1) ADHD. Stable long-term on Evekeo  (generic) 10 mg tabs, 2 tabs per day. CSC and UDS UTD.   2)  GAD, doing well long-term on sertraline  100 mg a day. RF#90, additional RF x 1 today.  INTERIM HX: Andrew Christian feels well. He goes out of town on a business trip next week and is looking forward to it. Pt states all is going well with the med at current dosing (if Evekeo  10 mg tabs, 2 tabs per day): much improved focus, concentration, task completion.  Less frustration, better multitasking, less impulsivity and restlessness.  Mood is stable. No side effects from the medication.  Mood is stable and anxiety level stable on sertraline  100 mg a day.  Past Medical History:  Diagnosis Date   ADHD (attention deficit hyperactivity disorder)    managed by psych   Anxiety    Medial meniscus tear 07/2016   right knee   Obesity, Class I, BMI 30-34.9     Past Surgical History:  Procedure Laterality Date   KNEE ARTHROSCOPY Bilateral    SHOULDER ARTHROSCOPY Right 01/2021   Right glenoid labral repair    Outpatient Medications Prior to Visit  Medication Sig Dispense Refill   Amphetamine  Sulfate (EVEKEO ) 10 MG TABS 1 tab po bid 60 tablet 0   sertraline  (ZOLOFT ) 100 MG tablet 1 tab po every day 90 tablet 1   No facility-administered medications prior to visit.    No Known Allergies  Review of Systems As per HPI  PE:    07/22/2023   11:06 AM 03/19/2023    7:54 AM 11/14/2022    3:43 PM  Vitals with BMI  Height 6' 0 6' 0 6' 0  Weight 223 lbs 6 oz 223 lbs 3 oz 225 lbs 3 oz  BMI 30.29 30.26 30.54  Systolic 107 113 621  Diastolic 71 73 65  Pulse 69 60 61     Physical Exam  Gen: Alert, well appearing.  Patient is oriented to person, place, time, and  situation. AFFECT: pleasant, lucid thought and speech. No further exam today  LABS:  none  IMPRESSION AND PLAN:  1) ADHD. Stable long-term on Evekeo  (generic) 10 mg tabs, 2 tabs per day. 30-day prescription today. CSC and UDS ARE UTD.   2)  GAD, doing well long-term on sertraline  100 mg a day. RF#90, additional RF x 3 today.  An After Visit Summary was printed and given to the patient.  FOLLOW UP: Return in about 6 months (around 01/21/2024) for routine chronic illness f/u.  Signed:  Arletha Lady, MD           07/22/2023

## 2023-09-23 ENCOUNTER — Encounter: Payer: Self-pay | Admitting: Family Medicine

## 2023-09-24 MED ORDER — AMPHETAMINE SULFATE 10 MG PO TABS: ORAL_TABLET | ORAL | 0 refills | Status: DC

## 2023-09-28 ENCOUNTER — Other Ambulatory Visit: Payer: Self-pay | Admitting: Family Medicine

## 2023-10-21 NOTE — Progress Notes (Unsigned)
   LILLETTE Ileana Collet, PhD, LAT, ATC acting as a scribe for Artist Lloyd, MD.  Andrew Christian is a 23 y.o. male who presents to [facility] at [facility] today for bilat hand pain ongoing since the 13th. Pt was playing in a rugby match, and was hit on the top of his head, then experienced numbness in bilat UE. This resolved within a few hours, but pain has cont'd in his fingers. Pt locates pain to the 1st-2nd fingers on both hands  Radiates: no Paresthesia: has resolved Grip strength: WNL Aggravates: thumb movements Treatments tried: IBU  Pertinent review of systems: No fevers or chills  Relevant historical information: ADHD.  History of prior stingers in the past.  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   Exam:  BP 126/84   Pulse 64   Ht 6' (1.829 m)   Wt 228 lb (103.4 kg)   SpO2 97%   BMI 30.92 kg/m  General: Well Developed, well nourished, and in no acute distress.   MSK: C-Spine: Normal appearing Nontender palpation spinal midline. Normal cervical motion. Upper extremity strength mildly reduced shoulder abduction bilaterally 4+/5.  Otherwise upper extremity strength and reflexes and sensation are intact. Mildly positive left-sided Spurling's test.   Lab and Radiology Results  X-ray images C-spine with flexion and extension views obtained today personally and independently interpreted. Mild DDD C5-C6.  No acute fractures are visible.  No significant shifting on flexion or extension views. Await formal radiology review    Assessment and Plan: 23 y.o. male with bilateral upper extremity paresthesias and pain with some mild weakness following a axial top of head impact injury while participating in rugby.  This is concerning for spinal cord injury or contusion and is not a typical stinger.  We discussed abstaining from rugby or other high impact sports until further evaluation.  Will proceed with  x-ray C-spine with flexion and extension views today followed by MRI C-spine.  If no significant spinal cord impingement or stenosis is discovered on MRI he can return to rugby when neurologically he is intact again.   PDMP not reviewed this encounter. Orders Placed This Encounter  Procedures   DG Cervical Spine With Flex & Extend    Standing Status:   Future    Number of Occurrences:   1    Expiration Date:   10/21/2024    Reason for Exam (SYMPTOM  OR DIAGNOSIS REQUIRED):   cervical instability    Preferred imaging location?:    [facility]   MR CERVICAL SPINE WO CONTRAST    Standing Status:   Future    Expiration Date:   11/21/2023    What is the patient's sedation requirement?:   No Sedation    Does the patient have a pacemaker or implanted devices?:   No    Preferred imaging location?:   [facility] (table limit-500lbs)   No orders of the defined types were placed in this encounter.    Discussed warning signs or symptoms. Please see discharge instructions. Patient expresses understanding.   The above documentation has been reviewed and is accurate and complete Artist Lloyd, M.D.

## 2023-10-22 ENCOUNTER — Ambulatory Visit

## 2023-10-22 ENCOUNTER — Ambulatory Visit (INDEPENDENT_AMBULATORY_CARE_PROVIDER_SITE_OTHER): Admitting: Family Medicine

## 2023-10-22 VITALS — BP 126/84 | HR 64 | Ht 72.0 in | Wt 228.0 lb

## 2023-10-22 DIAGNOSIS — R29898 Other symptoms and signs involving the musculoskeletal system: Secondary | ICD-10-CM | POA: Diagnosis not present

## 2023-10-22 DIAGNOSIS — M542 Cervicalgia: Secondary | ICD-10-CM | POA: Diagnosis not present

## 2023-10-22 NOTE — Patient Instructions (Addendum)
 Thank you for coming in today.   Please get an Xray today before you leave   You should hear from MRI scheduling within 1 week. If you do not hear please let me know.    Check back after we get the MRI results

## 2023-10-28 ENCOUNTER — Ambulatory Visit: Payer: Self-pay | Admitting: Family Medicine

## 2023-10-28 NOTE — Progress Notes (Signed)
Cervical spine x-ray looks okay to radiology.

## 2023-11-08 ENCOUNTER — Ambulatory Visit

## 2023-11-08 DIAGNOSIS — R29898 Other symptoms and signs involving the musculoskeletal system: Secondary | ICD-10-CM | POA: Diagnosis not present

## 2023-11-08 DIAGNOSIS — M542 Cervicalgia: Secondary | ICD-10-CM

## 2023-11-08 DIAGNOSIS — M50322 Other cervical disc degeneration at C5-C6 level: Secondary | ICD-10-CM

## 2023-11-10 NOTE — Progress Notes (Signed)
 Cervical spine MRI does show disc bulging and mild pinched nerve.  Fortunately no spinal cord injury.  Recommend return to clinic to go over results in full detail.

## 2023-11-17 NOTE — Progress Notes (Unsigned)
 LILLETTE Ileana Collet, PhD, LAT, ATC acting as a scribe for Artist Lloyd, MD.  Andrew Christian is a 23 y.o. male who presents to [facility] at [facility] today for f/u neck pain w/ bilat UE paresthesias w/ MRI review. Pt was last seen by Dr. Lloyd on 10/22/23 and was advised to abstain from contact sports and MRI was ordered.  Today, pt reports bilat UE paresthesias is mostly resolved. He note his neck feels a bit tight.  Dx imaging: 11/08/23 C-spine MRI  10/22/23 C-spine XR  Pertinent review of systems: No fevers or chills  Relevant historical information: ADHD.  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  He is a former [employer].   Exam:  BP 128/84   Pulse 66   Ht 6' (1.829 m)   Wt 227 lb (103 kg)   SpO2 98%   BMI 30.79 kg/m  General: Well Developed, well nourished, and in no acute distress.   MSK: C-spine normal cervical motion upper extremity strength is intact.    Lab and Radiology Results  EXAM: MRI CERVICAL SPINE WITHOUT CONTRAST   TECHNIQUE: Multiplanar, multisequence MR imaging of the cervical spine was performed. No intravenous contrast was administered.   COMPARISON:  None Available.   FINDINGS: The craniocervical junction is normal.   There is mild edema in the vertebral endplates at C5-6 due to degenerative disc disease.   The cervical spinal cord is normal.   C2-C3: The disc is normal. Mild facet arthropathy. No spinal stenosis or foraminal stenosis   C3-C4: There is mild degenerative disc disease with a disc bulge and small foraminal spurs. No significant facet disease. There is no spinal stenosis or significant foraminal stenosis   C4-C5: There is mild degenerative disc disease with a mild disc bulge. No significant facet disease. There is no spinal stenosis or foraminal stenosis   C5-C6: There is moderate degenerative disc disease with a mild disc bulge. There are small foraminal spurs. No  significant facet disease. There is mild bilateral neural foraminal stenosis.   C6-C7: Mild degenerative disc disease with mild disc bulge. No significant facet disease. No spinal stenosis or foraminal stenosis   C7-T1: Normal   IMPRESSION: Moderate degenerative disc disease at C5-6 with edema in the vertebral endplates. There is a disc bulge with bilateral disc/foraminal spurs and mild bilateral neural foraminal stenosis.   Otherwise mild degenerative changes     Electronically Signed   By: Nancyann Burns M.D.   On: 11/09/2023 16:38 I, Artist Lloyd, personally (independently) visualized and performed the interpretation of the images attached in this note.     Assessment and Plan: 23 y.o. male with bilateral arm paresthesias following a axial impact to his head and neck while playing rugby.  MRI does show evidence of disc injury at C5-6 with some neuroforaminal stenosis.  Fortunately he did not have a spinal cord injury.  We talked about safety of returning to rugby.  He does not have enough spinal stenosis that rugby is definitely contraindicated.  However his risk is a little higher than the average population.  We talked about return to exercise.  I recommend waiting on rugby until completely asymptomatic.  We did offer a second opinion with a neurosurgeon about safety of rugby and other contact sports.  He agreed I think that is a good idea.  Will go ahead and place referral to neurosurgery to have a second opinion about return to rugby.   PDMP not  reviewed this encounter. Orders Placed This Encounter  Procedures   Ambulatory referral to Neurosurgery    Referral Priority:   Routine    Referral Type:   Surgical    Referral Reason:   Specialty Services Required    Requested Specialty:   Neurosurgery    Number of Visits Requested:   1   No orders of the defined types were placed in this encounter.    Discussed warning signs or symptoms. Please see discharge instructions.  Patient expresses understanding.   The above documentation has been reviewed and is accurate and complete Artist Lloyd, M.D.

## 2023-11-18 ENCOUNTER — Ambulatory Visit: Admitting: Family Medicine

## 2023-11-18 ENCOUNTER — Encounter: Payer: Self-pay | Admitting: Family Medicine

## 2023-11-18 VITALS — BP 128/84 | HR 66 | Ht 72.0 in | Wt 227.0 lb

## 2023-11-18 DIAGNOSIS — R29898 Other symptoms and signs involving the musculoskeletal system: Secondary | ICD-10-CM

## 2023-11-18 DIAGNOSIS — M542 Cervicalgia: Secondary | ICD-10-CM

## 2023-11-18 NOTE — Patient Instructions (Addendum)
 Thank you for coming in today.   I've referred you to Neurosurgery for a 2nd opinion.  Let us  know if you don't hear from them in one week.   Check back as needed

## 2023-11-23 ENCOUNTER — Encounter: Payer: Self-pay | Admitting: Family Medicine

## 2023-11-23 MED ORDER — AMPHETAMINE SULFATE 10 MG PO TABS: ORAL_TABLET | ORAL | 0 refills | Status: DC

## 2023-11-23 NOTE — Telephone Encounter (Signed)
 Evekio rx sent

## 2023-12-08 ENCOUNTER — Ambulatory Visit (INDEPENDENT_AMBULATORY_CARE_PROVIDER_SITE_OTHER): Admitting: Neurosurgery

## 2023-12-08 ENCOUNTER — Encounter: Payer: Self-pay | Admitting: Neurosurgery

## 2023-12-08 VITALS — BP 124/80 | Ht 72.0 in | Wt 229.1 lb

## 2023-12-08 DIAGNOSIS — M4802 Spinal stenosis, cervical region: Secondary | ICD-10-CM | POA: Diagnosis not present

## 2023-12-08 DIAGNOSIS — R29818 Other symptoms and signs involving the nervous system: Secondary | ICD-10-CM

## 2023-12-08 NOTE — Progress Notes (Unsigned)
 Referring Physician:  Candise Aleene DEL, MD [address] [place],  [place] [address]  Primary Physician:  Candise Aleene DEL, MD  History of Present Illness: 12/08/2023 [name] is here today with a chief complaint of neck pain and numbness following a rugby injury.  In mid-September, Andrew Christian sustained a neck injury during a rugby tackle, experiencing direct contact to the neck followed by a 'flash' sensation and numbness radiating down both arms into the fingers. This numbness persisted for several days to a week, longer than previous episodes.  He has a history of playing contact sports, including football in high school and college, with multiple 'stingers' typically affecting one side and resolving quickly. This recent incident was the first to affect both arms simultaneously.  Following the injury, he underwent an MRI and has since refrained from returning to rugby or engaging in serious physical activity, opting for rest and recovery.   Discussed the use of AI scribe software for clinical note transcription with the patient, who gave verbal consent to proceed.  Bowel/Bladder Dysfunction: none  Conservative measures:  Physical therapy:  has not participated in Multimodal medical therapy including regular antiinflammatories:  Advil Injections:  no epidural steroid injections  Past Surgery: no spinal surgeries  Andrew Christian has no symptoms of cervical myelopathy.  The symptoms are causing a significant impact on the patient's life.   I have utilized the care everywhere function in epic to review the outside records available from external health systems.   Review of Systems:  A 10 point review of systems is negative, except for the pertinent positives and negatives detailed in the HPI.  Past Medical History: Past Medical History:  Diagnosis Date   ADHD (attention deficit hyperactivity disorder)    managed by psych   Anxiety    Medial meniscus tear  07/2016   right knee   Obesity, Class I, BMI 30-34.9     Past Surgical History: Past Surgical History:  Procedure Laterality Date   KNEE ARTHROSCOPY Bilateral    SHOULDER ARTHROSCOPY Right 01/2021   Right glenoid labral repair    Allergies: Allergies as of 12/08/2023   (No Known Allergies)    Medications:  Current Outpatient Medications:    Amphetamine  Sulfate (EVEKEO ) 10 MG TABS, 1 tab po bid, Disp: 60 tablet, Rfl: 0   sertraline  (ZOLOFT ) 100 MG tablet, 1 tab po every day, Disp: 90 tablet, Rfl: 3  Social History: Social History   Tobacco Use   Smoking status: Never   Smokeless tobacco: Never  Vaping Use   Vaping status: Never Used  Substance Use Topics   Alcohol use: No    Alcohol/week: 0.0 standard drinks of alcohol   Drug use: No    Family Medical History: Family History  Problem Relation Age of Onset   Thyroid disease Mother    High Cholesterol Father    Prostate cancer Father    ADD / ADHD Sister    Thyroid disease Maternal Grandmother    Heart disease Maternal Grandfather    Heart attack Maternal Grandfather    High Cholesterol Maternal Grandfather    Depression Paternal Grandmother     Physical Examination: Vitals:   12/08/23 1400  BP: 124/80    General: Patient is in no apparent distress. Attention to examination is appropriate.  Neck:   Supple.  Full range of motion.  Respiratory: Patient is breathing without any difficulty.   NEUROLOGICAL:     Awake, alert, oriented to person,  place, and time.  Speech is clear and fluent.   Cranial Nerves: Pupils equal round and reactive to light.  Facial tone is symmetric.  Facial sensation is symmetric. Shoulder shrug is symmetric. Tongue protrusion is midline.  There is no pronator drift.  Strength: Side Biceps Triceps Deltoid Interossei Grip Wrist Ext. Wrist Flex.  R 5 5 5 5 5 5 5   L 5 5 5 5 5 5 5    Side Iliopsoas Quads Hamstring PF DF EHL  R 5 5 5 5 5 5   L 5 5 5 5 5 5    Reflexes are 1+ and  symmetric at the biceps, triceps, brachioradialis, patella and achilles.   Hoffman's is absent.   Bilateral upper and lower extremity sensation is intact to light touch.    No evidence of dysmetria noted.  Gait is normal.     Medical Decision Making  Imaging: MRI C spine 11/08/2023 IMPRESSION: Moderate degenerative disc disease at C5-6 with edema in the vertebral endplates. There is a disc bulge with bilateral disc/foraminal spurs and mild bilateral neural foraminal stenosis.   Otherwise mild degenerative changes     Electronically Signed   By: Nancyann Burns M.D.   On: 11/09/2023 16:38  I have personally reviewed the images and agree with the above interpretation.  Assessment and Plan: Andrew Christian is a pleasant 23 y.o. male with history of multiple stingers.  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    On his x-rays, he has had significant degeneration at C5-6 in the last 4 years.      Thank you for involving me in the care of this patient.      Sheray Grist K. Clois MD, [facility] Neurosurgery

## 2024-01-24 ENCOUNTER — Encounter: Payer: Self-pay | Admitting: Family Medicine

## 2024-01-25 MED ORDER — AMPHETAMINE SULFATE 10 MG PO TABS: ORAL_TABLET | ORAL | 0 refills | Status: AC

## 2024-01-25 NOTE — Telephone Encounter (Signed)
 Prescription sent
